# Patient Record
Sex: Male | Born: 1965 | Race: White | Hispanic: No | Marital: Single | Smoking: Current every day smoker
Health system: Southern US, Community
[De-identification: ages and names within clinical notes are randomized; demographics above are authoritative.]

## PROBLEM LIST (undated history)

## (undated) HISTORY — PX: BACK SURGERY: SHX140

## (undated) HISTORY — PX: HERNIA REPAIR: SHX51

---

## 2017-07-29 ENCOUNTER — Emergency Department (HOSPITAL_COMMUNITY)
Admission: EM | Admit: 2017-07-29 | Discharge: 2017-07-29 | Disposition: A | Discharge status: Home / Self Care | Payer: Self-pay | Attending: Emergency Medicine | Admitting: Emergency Medicine

## 2017-07-29 ENCOUNTER — Other Ambulatory Visit: Payer: Self-pay

## 2017-07-29 ENCOUNTER — Emergency Department (HOSPITAL_COMMUNITY): Payer: Self-pay

## 2017-07-29 ENCOUNTER — Encounter (HOSPITAL_COMMUNITY): Payer: Self-pay

## 2017-07-29 DIAGNOSIS — M79672 Pain in left foot: Secondary | ICD-10-CM | POA: Insufficient documentation

## 2017-07-29 DIAGNOSIS — R112 Nausea with vomiting, unspecified: Secondary | ICD-10-CM | POA: Insufficient documentation

## 2017-07-29 DIAGNOSIS — M25571 Pain in right ankle and joints of right foot: Secondary | ICD-10-CM

## 2017-07-29 DIAGNOSIS — W1789XA Other fall from one level to another, initial encounter: Secondary | ICD-10-CM | POA: Insufficient documentation

## 2017-07-29 DIAGNOSIS — Y92008 Other place in unspecified non-institutional (private) residence as the place of occurrence of the external cause: Secondary | ICD-10-CM | POA: Insufficient documentation

## 2017-07-29 DIAGNOSIS — Y999 Unspecified external cause status: Secondary | ICD-10-CM | POA: Insufficient documentation

## 2017-07-29 DIAGNOSIS — M79671 Pain in right foot: Secondary | ICD-10-CM | POA: Insufficient documentation

## 2017-07-29 DIAGNOSIS — F17210 Nicotine dependence, cigarettes, uncomplicated: Secondary | ICD-10-CM | POA: Insufficient documentation

## 2017-07-29 DIAGNOSIS — Y9389 Activity, other specified: Secondary | ICD-10-CM | POA: Insufficient documentation

## 2017-07-29 DIAGNOSIS — W19XXXA Unspecified fall, initial encounter: Secondary | ICD-10-CM

## 2017-07-29 LAB — COMPREHENSIVE METABOLIC PANEL
ALBUMIN: 3.7 g/dL (ref 3.5–5.0)
ALT: 16 U/L — ABNORMAL LOW (ref 17–63)
ANION GAP: 9 (ref 5–15)
AST: 28 U/L (ref 15–41)
Alkaline Phosphatase: 74 U/L (ref 38–126)
BILIRUBIN TOTAL: 0.7 mg/dL (ref 0.3–1.2)
BUN: 13 mg/dL (ref 6–20)
CHLORIDE: 107 mmol/L (ref 101–111)
CO2: 22 mmol/L (ref 22–32)
Calcium: 9.1 mg/dL (ref 8.9–10.3)
Creatinine, Ser: 0.74 mg/dL (ref 0.61–1.24)
GFR calc Af Amer: >60 mL/min (ref >60)
GFR calc non Af Amer: >60 mL/min (ref >60)
GLUCOSE: 116 mg/dL — AB (ref 65–99)
Potassium: 3.3 mmol/L — ABNORMAL LOW (ref 3.5–5.1)
SODIUM: 138 mmol/L (ref 135–145)
TOTAL PROTEIN: 7.1 g/dL (ref 6.5–8.1)

## 2017-07-29 LAB — CBC
HEMATOCRIT: 42.9 % (ref 39.0–52.0)
HEMOGLOBIN: 14.4 g/dL (ref 13.0–17.0)
MCH: 30.7 pg (ref 26.0–34.0)
MCHC: 33.6 g/dL (ref 30.0–36.0)
MCV: 91.5 fL (ref 78.0–100.0)
Platelets: 294 10*3/uL (ref 150–400)
RBC: 4.69 MIL/uL (ref 4.22–5.81)
RDW: 13.6 % (ref 11.5–15.5)
WBC: 11.4 10*3/uL — ABNORMAL HIGH (ref 4.0–10.5)

## 2017-07-29 LAB — LIPASE, BLOOD: LIPASE: 46 U/L (ref 11–51)

## 2017-07-29 MED ORDER — ONDANSETRON HCL 4 MG/2ML IJ SOLN
4.0000 mg | Freq: Once | INTRAMUSCULAR | Status: AC | PRN
  Administered 2017-07-29: 4 mg via INTRAVENOUS
  Filled 2017-07-29: qty 2

## 2017-07-29 MED ORDER — ACETAMINOPHEN 500 MG PO TABS: 1000.0000 mg | ORAL_TABLET | Freq: Three times a day (TID) | ORAL | 0 refills | Status: AC

## 2017-07-29 MED ORDER — KETOROLAC TROMETHAMINE 15 MG/ML IJ SOLN
15.0000 mg | Freq: Once | INTRAMUSCULAR | Status: AC
  Administered 2017-07-29: 15 mg via INTRAVENOUS
  Filled 2017-07-29: qty 1

## 2017-07-29 NOTE — ED Provider Notes (Signed)
[facility] EMERGENCY DEPARTMENT Provider Note  CSN: [id number] Arrival date & time: 07/29/17 0831  Chief Complaint(s) Marletta Lor Burgess Estelle ); Emesis; and Diarrhea  HPI Todd Hooper is a 52 y.o. male who presents with throbbing bilateral feet and ankle pain, and left hip pain following a reported fall off of the roof onto concrete.  Patient states that he was helping his friend fix a roof when he slipped and fell.  States that he landed on his feet and rolled onto his side.  Endorsed minor head injury but no loss of consciousness or amnesia to the event.  Denies any current headache or visual disturbance.  No neck pain.  No chest or back pain.  No abdominal pain.  Pain is exacerbated with ambulation, movement and palpation of the affected regions.  Patient did report that he was able to walk a mile this morning.  Has tried taking Motrin for the pain which provided minimal relief.  Denies any other associated injuries or physical complaints.  HPI  Past Medical History History reviewed. No pertinent past medical history. There are no active problems to display for this patient.  Home Medication(s) Prior to Admission medications   Medication Sig Start Date End Date Taking? Authorizing Provider  ibuprofen (ADVIL,MOTRIN) 200 MG tablet Take 800 mg by mouth as needed for moderate pain.   Yes [provider]  acetaminophen (TYLENOL) 500 MG tablet Take 2 tablets (1,000 mg total) by mouth every 8 (eight) hours for 5 days. Do not take more than 4000 mg of acetaminophen (Tylenol) in a 24-hour period. Please note that other medicines that you may be prescribed may have Tylenol as well. 07/29/17 08/03/17  Nira Conn, MD                                                                                                                                    Past Surgical History Past Surgical History:  Procedure Laterality Date  . BACK SURGERY    . HERNIA REPAIR     Family  History No family history on file.  Social History Social History   Tobacco Use  . Smoking status: Current Every Day Smoker    Types: Cigarettes  . Smokeless tobacco: Never Used  Substance Use Topics  . Alcohol use: Yes    Comment: Occasionally   . Drug use: Never   Allergies Patient has no known allergies.  Review of Systems Review of Systems All other systems are reviewed and are negative for acute change except as noted in the HPI  Physical Exam Vital Signs  I have reviewed the triage vital signs BP 109/68   Pulse 71   Temp 98.1 F (36.7 C) (Oral)   Resp 20   SpO2 98%   Physical Exam  Constitutional: He is oriented to person, place, and time. He appears well-developed and well-nourished. No distress.  HENT:  Head: Normocephalic.  Right Ear:  External ear normal.  Left Ear: External ear normal.  Mouth/Throat: Oropharynx is clear and moist.  Eyes: Pupils are equal, round, and reactive to light. Conjunctivae and EOM are normal. Right eye exhibits no discharge. Left eye exhibits no discharge. No scleral icterus.  Neck: Normal range of motion. Neck supple.  Cardiovascular: Regular rhythm and normal heart sounds. Exam reveals no gallop and no friction rub.  No murmur heard. Pulses:      Radial pulses are 2+ on the right side, and 2+ on the left side.       Dorsalis pedis pulses are 2+ on the right side, and 2+ on the left side.  Pulmonary/Chest: Effort normal and breath sounds normal. No stridor. No respiratory distress.  Abdominal: Soft. He exhibits no distension. There is no tenderness.  Musculoskeletal:       Left elbow: He exhibits normal range of motion and no deformity. No tenderness found.       Left hip: He exhibits tenderness. He exhibits normal range of motion.       Right ankle: He exhibits swelling (mild). He exhibits normal range of motion, no deformity and normal pulse. Tenderness. Lateral malleolus and medial malleolus tenderness found. Achilles tendon  exhibits no pain.       Left ankle: He exhibits normal range of motion, no swelling and normal pulse. No tenderness. Achilles tendon exhibits no pain.       Cervical back: He exhibits no bony tenderness.       Thoracic back: He exhibits no bony tenderness.       Lumbar back: He exhibits no bony tenderness.       Arms:      Right foot: There is tenderness.       Left foot: There is tenderness.  Clavicle stable. Chest stable to AP/Lat compression. Pelvis stable to Lat compression. No obvious extremity deformity. No chest or abdominal wall contusion.  Neurological: He is alert and oriented to person, place, and time. GCS eye subscore is 4. GCS verbal subscore is 5. GCS motor subscore is 6.  Moving all extremities   Skin: Skin is warm. He is not diaphoretic.    ED Results and Treatments Labs (all labs ordered are listed, but only abnormal results are displayed) Labs Reviewed  COMPREHENSIVE METABOLIC PANEL - Abnormal; Notable for the following components:      Result Value   Potassium 3.3 (*)    Glucose, Bld 116 (*)    ALT 16 (*)    All other components within normal limits  CBC - Abnormal; Notable for the following components:   WBC 11.4 (*)    All other components within normal limits  LIPASE, BLOOD                                                                                                                         EKG  EKG Interpretation  Date/Time:    Ventricular Rate:    PR Interval:  QRS Duration:   QT Interval:    QTC Calculation:   R Axis:     Text Interpretation:        Radiology Dg Ankle Complete Right  Result Date: 07/29/2017 CLINICAL DATA:  Fall from roof.  Bilateral plantar foot pain. EXAM: RIGHT ANKLE - COMPLETE 3+ VIEW COMPARISON:  None. FINDINGS: There is no evidence of fracture, dislocation, or joint effusion. There is no evidence of arthropathy or other focal bone abnormality. Soft tissues are unremarkable. IMPRESSION: Negative. Electronically  Signed   By: Charlett NoseKevin  Dover M.D.   On: 07/29/2017 10:16   Dg Foot Complete Left  Result Date: 07/29/2017 CLINICAL DATA:  Fall from roof.  Foot pain bilaterally. EXAM: LEFT FOOT - COMPLETE 3+ VIEW COMPARISON:  None. FINDINGS: No acute bony abnormality. Specifically, no fracture, subluxation, or dislocation. IMPRESSION: No acute bony abnormality. Electronically Signed   By: Charlett NoseKevin  Dover M.D.   On: 07/29/2017 10:16   Dg Foot Complete Right  Result Date: 07/29/2017 CLINICAL DATA:  Larey SeatFell off roof, bilateral foot pain. EXAM: RIGHT FOOT COMPLETE - 3+ VIEW COMPARISON:  None. FINDINGS: No acute bony abnormality. Specifically, no fracture, subluxation, or dislocation. Degenerative changes at the 1st IP joint with joint space narrowing and spurring. IMPRESSION: No acute bony abnormality. Electronically Signed   By: Charlett NoseKevin  Dover M.D.   On: 07/29/2017 10:15   Dg Hip Unilat W Or Wo Pelvis 2-3 Views Left  Result Date: 07/29/2017 CLINICAL DATA:  Larey SeatFell off roof yesterday.  Pain. EXAM: DG HIP (WITH OR WITHOUT PELVIS) 2-3V LEFT COMPARISON:  None. FINDINGS: There is no evidence of hip fracture or dislocation. There is no evidence of arthropathy or other focal bone abnormality. Degenerative change lumbar spine. IMPRESSION: Negative. Electronically Signed   By: Elsie StainJohn T Curnes M.D.   On: 07/29/2017 10:04   Pertinent labs & imaging results that were available during my care of the patient were reviewed by me and considered in my medical decision making (see chart for details).  Medications Ordered in ED Medications  ondansetron (ZOFRAN) injection 4 mg (4 mg Intravenous Given 07/29/17 0901)  ketorolac (TORADOL) 15 MG/ML injection 15 mg (15 mg Intravenous Given 07/29/17 1003)                                                                                                                                    Procedures Procedures  (including critical care time)  Medical Decision Making / ED Course I have reviewed the nursing  notes for this encounter and the patient's prior records (if available in EHR or on provided paperwork).    Patient here after a fall from a roof with bilateral feet and left hip pain.  Exam as above.  Will obtain plain films to assess for any bony injuries.  He reports minor head injury.  No focal deficits on exam.  Low suspicion for ICH.  No need for advanced imaging.  Patient  provided with IV Toradol  Plain films negative. Provided with CAM walker for comfort.  The patient appears reasonably screened and/or stabilized for discharge and I doubt any other medical condition or other [facility] requiring further screening, evaluation, or treatment in the ED at this time prior to discharge.  The patient is safe for discharge with strict return precautions.   Final Clinical Impression(s) / ED Diagnoses Final diagnoses:  Fall  Pain in both feet  Acute right ankle pain   Disposition: Discharge  Condition: Good  I have discussed the results, Dx and Tx plan with the patient who expressed understanding and agree(s) with the plan. Discharge instructions discussed at great length. The patient was given strict return precautions who verbalized understanding of the instructions. No further questions at time of discharge.    ED Discharge Orders        Ordered    acetaminophen (TYLENOL) 500 MG tablet  Every 8 hours     07/29/17 1139       Follow Up: Primary care provider  Schedule an appointment as soon as possible for a visit  As needed      This chart was dictated using voice recognition software.  Despite best efforts to proofread,  errors can occur which can change the documentation meaning.   Nira Conn, MD 07/29/17 1140

## 2017-07-29 NOTE — ED Notes (Signed)
Dr. Cardama at bedside.  

## 2017-07-29 NOTE — ED Triage Notes (Addendum)
Per [facility]: Pt was on roof yesterday around 4 pm and fell off. Pt estimates 15-20 feet, landed on his feet first then fell to the left side. No LOC. Cleared SCCA. No neck or back pain. No neuro deficits. Pt c/o pain to bilateral feet/ankles. Swelling to right ankle. Pulses present in both feet. Pt reportedly also walked about 1 mile this morning. Also c/o left elbow pain, no deformity, full ROM. Pt also c/o of left groin pain. Unrelated to the fall pt has had nausea/vomiting/diarrhea throughout the night. Denies SOB, no abd or rib pain. 16 g R AC.   Pt has had no meds PTA. Pt tried heat and ice packs at home and states that this did not help.

## 2017-07-29 NOTE — ED Notes (Signed)
Patient transported to X-ray 

## 2017-07-29 NOTE — Progress Notes (Signed)
Orthopedic Tech Progress Note Patient Details:  Todd GuthrieScott Hooper 04/15/1965 [id number]  Ortho Devices Type of Ortho Device: CAM walker Ortho Device/Splint Location: rle Ortho Device/Splint Interventions: Application   Post Interventions Patient Tolerated: Well Instructions Provided: Care of device   Nikki DomCrawford, Ariaunna Longsworth 07/29/2017, 11:01 AM

## 2017-07-29 NOTE — ED Notes (Signed)
Paged ortho tech for cam walker.

## 2017-07-29 NOTE — ED Notes (Signed)
Got patient undress on the monitor patient is resting with family at bedside and call bell in reach 

## 2018-01-31 ENCOUNTER — Emergency Department (HOSPITAL_COMMUNITY): Admission: EM | Admit: 2018-01-31 | Discharge: 2018-01-31 | Discharge status: Against Medical Advice | Payer: Self-pay

## 2018-06-03 ENCOUNTER — Emergency Department (HOSPITAL_COMMUNITY): Payer: Self-pay

## 2018-06-03 ENCOUNTER — Inpatient Hospital Stay (HOSPITAL_COMMUNITY): Payer: Self-pay

## 2018-06-03 ENCOUNTER — Encounter (HOSPITAL_COMMUNITY): Payer: Self-pay | Admitting: Emergency Medicine

## 2018-06-03 ENCOUNTER — Other Ambulatory Visit: Payer: Self-pay

## 2018-06-03 ENCOUNTER — Inpatient Hospital Stay (HOSPITAL_COMMUNITY)
Admission: EM | Admit: 2018-06-03 | Discharge: 2018-06-12 | DRG: 163 | Discharge status: Against Medical Advice | Payer: Self-pay | Attending: Family Medicine | Admitting: Family Medicine

## 2018-06-03 DIAGNOSIS — R002 Palpitations: Secondary | ICD-10-CM | POA: Diagnosis present

## 2018-06-03 DIAGNOSIS — Z09 Encounter for follow-up examination after completed treatment for conditions other than malignant neoplasm: Secondary | ICD-10-CM

## 2018-06-03 DIAGNOSIS — D62 Acute posthemorrhagic anemia: Secondary | ICD-10-CM | POA: Diagnosis not present

## 2018-06-03 DIAGNOSIS — Z4682 Encounter for fitting and adjustment of non-vascular catheter: Secondary | ICD-10-CM

## 2018-06-03 DIAGNOSIS — Z590 Homelessness: Secondary | ICD-10-CM

## 2018-06-03 DIAGNOSIS — E861 Hypovolemia: Secondary | ICD-10-CM | POA: Diagnosis not present

## 2018-06-03 DIAGNOSIS — Z720 Tobacco use: Secondary | ICD-10-CM | POA: Diagnosis present

## 2018-06-03 DIAGNOSIS — L02414 Cutaneous abscess of left upper limb: Secondary | ICD-10-CM | POA: Diagnosis present

## 2018-06-03 DIAGNOSIS — F17210 Nicotine dependence, cigarettes, uncomplicated: Secondary | ICD-10-CM | POA: Diagnosis present

## 2018-06-03 DIAGNOSIS — R0600 Dyspnea, unspecified: Secondary | ICD-10-CM

## 2018-06-03 DIAGNOSIS — E871 Hypo-osmolality and hyponatremia: Secondary | ICD-10-CM | POA: Diagnosis not present

## 2018-06-03 DIAGNOSIS — R0689 Other abnormalities of breathing: Secondary | ICD-10-CM

## 2018-06-03 DIAGNOSIS — J189 Pneumonia, unspecified organism: Secondary | ICD-10-CM | POA: Diagnosis present

## 2018-06-03 DIAGNOSIS — J181 Lobar pneumonia, unspecified organism: Secondary | ICD-10-CM

## 2018-06-03 DIAGNOSIS — J15212 Pneumonia due to Methicillin resistant Staphylococcus aureus: Principal | ICD-10-CM | POA: Diagnosis present

## 2018-06-03 DIAGNOSIS — Z9689 Presence of other specified functional implants: Secondary | ICD-10-CM

## 2018-06-03 DIAGNOSIS — F1120 Opioid dependence, uncomplicated: Secondary | ICD-10-CM | POA: Diagnosis present

## 2018-06-03 DIAGNOSIS — Z9889 Other specified postprocedural states: Secondary | ICD-10-CM

## 2018-06-03 DIAGNOSIS — K567 Ileus, unspecified: Secondary | ICD-10-CM | POA: Diagnosis not present

## 2018-06-03 DIAGNOSIS — F1410 Cocaine abuse, uncomplicated: Secondary | ICD-10-CM | POA: Diagnosis present

## 2018-06-03 DIAGNOSIS — J869 Pyothorax without fistula: Secondary | ICD-10-CM | POA: Diagnosis present

## 2018-06-03 DIAGNOSIS — F1910 Other psychoactive substance abuse, uncomplicated: Secondary | ICD-10-CM | POA: Diagnosis present

## 2018-06-03 LAB — TROPONIN I

## 2018-06-03 LAB — BASIC METABOLIC PANEL
Anion gap: 12 (ref 5–15)
BUN: 14 mg/dL (ref 6–20)
CO2: 25 mmol/L (ref 22–32)
CREATININE: 0.75 mg/dL (ref 0.61–1.24)
Calcium: 8.6 mg/dL — ABNORMAL LOW (ref 8.9–10.3)
Chloride: 92 mmol/L — ABNORMAL LOW (ref 98–111)
GFR calc Af Amer: >60 mL/min (ref >60)
GLUCOSE: 123 mg/dL — AB (ref 70–99)
Potassium: 4.5 mmol/L (ref 3.5–5.1)
SODIUM: 129 mmol/L — AB (ref 135–145)

## 2018-06-03 LAB — RAPID URINE DRUG SCREEN, HOSP PERFORMED
Amphetamines: NOT DETECTED
Barbiturates: NOT DETECTED
Benzodiazepines: NOT DETECTED
Cocaine: NOT DETECTED
Opiates: POSITIVE — AB
Tetrahydrocannabinol: NOT DETECTED

## 2018-06-03 LAB — BODY FLUID CELL COUNT WITH DIFFERENTIAL
EOS FL: 0 %
Lymphs, Fluid: 3 %
Monocyte-Macrophage-Serous Fluid: 1 % — ABNORMAL LOW (ref 50–90)
Neutrophil Count, Fluid: 96 % — ABNORMAL HIGH (ref 0–25)
Total Nucleated Cell Count, Fluid: 7571 cu mm — ABNORMAL HIGH (ref 0–1000)

## 2018-06-03 LAB — LACTATE DEHYDROGENASE, PLEURAL OR PERITONEAL FLUID: LD, Fluid: 627 U/L — ABNORMAL HIGH (ref 3–23)

## 2018-06-03 LAB — PROTEIN, TOTAL: Total Protein: 6.2 g/dL — ABNORMAL LOW (ref 6.5–8.1)

## 2018-06-03 LAB — LACTIC ACID, PLASMA: Lactic Acid, Venous: 0.9 mmol/L (ref 0.5–1.9)

## 2018-06-03 LAB — GRAM STAIN

## 2018-06-03 LAB — PROTEIN, PLEURAL OR PERITONEAL FLUID: Total protein, fluid: 5.2 g/dL

## 2018-06-03 LAB — STREP PNEUMONIAE URINARY ANTIGEN: Strep Pneumo Urinary Antigen: NEGATIVE

## 2018-06-03 LAB — LACTATE DEHYDROGENASE: LDH: 93 U/L — ABNORMAL LOW (ref 98–192)

## 2018-06-03 LAB — D-DIMER, QUANTITATIVE: D-Dimer, Quant: 5.17 ug/mL-FEU — ABNORMAL HIGH (ref 0.00–0.50)

## 2018-06-03 MED ORDER — PIPERACILLIN-TAZOBACTAM 3.375 G IVPB 30 MIN
3.3750 g | Freq: Once | INTRAVENOUS | Status: AC
  Administered 2018-06-03: 3.375 g via INTRAVENOUS
  Filled 2018-06-03: qty 50

## 2018-06-03 MED ORDER — SODIUM CHLORIDE 0.9 % IV SOLN
3.0000 g | Freq: Three times a day (TID) | INTRAVENOUS | Status: DC
  Filled 2018-06-03: qty 3

## 2018-06-03 MED ORDER — GABAPENTIN 300 MG PO CAPS
300.0000 mg | ORAL_CAPSULE | Freq: Three times a day (TID) | ORAL | Status: DC
  Administered 2018-06-03 – 2018-06-11 (×25): 300 mg via ORAL
  Filled 2018-06-03 (×25): qty 1

## 2018-06-03 MED ORDER — MORPHINE SULFATE (PF) 4 MG/ML IV SOLN
4.0000 mg | Freq: Once | INTRAVENOUS | Status: AC
  Administered 2018-06-03: 4 mg via INTRAVENOUS
  Filled 2018-06-03: qty 1

## 2018-06-03 MED ORDER — SODIUM CHLORIDE 0.9 % IV SOLN
1.0000 g | Freq: Once | INTRAVENOUS | Status: AC
  Administered 2018-06-03: 1 g via INTRAVENOUS
  Filled 2018-06-03: qty 10

## 2018-06-03 MED ORDER — PIPERACILLIN-TAZOBACTAM 3.375 G IVPB
3.3750 g | Freq: Three times a day (TID) | INTRAVENOUS | Status: DC
  Administered 2018-06-04 – 2018-06-08 (×13): 3.375 g via INTRAVENOUS
  Filled 2018-06-03 (×17): qty 50

## 2018-06-03 MED ORDER — ONDANSETRON HCL 4 MG/2ML IJ SOLN
4.0000 mg | Freq: Once | INTRAMUSCULAR | Status: AC
  Administered 2018-06-03: 4 mg via INTRAVENOUS
  Filled 2018-06-03: qty 2

## 2018-06-03 MED ORDER — SODIUM CHLORIDE 0.9% FLUSH
3.0000 mL | Freq: Two times a day (BID) | INTRAVENOUS | Status: DC
  Administered 2018-06-03: 3 mL via INTRAVENOUS

## 2018-06-03 MED ORDER — IPRATROPIUM-ALBUTEROL 0.5-2.5 (3) MG/3ML IN SOLN
3.0000 mL | Freq: Four times a day (QID) | RESPIRATORY_TRACT | Status: DC
  Administered 2018-06-03 – 2018-06-04 (×4): 3 mL via RESPIRATORY_TRACT
  Filled 2018-06-03 (×4): qty 3

## 2018-06-03 MED ORDER — HEPARIN SODIUM (PORCINE) 5000 UNIT/ML IJ SOLN
5000.0000 [IU] | Freq: Three times a day (TID) | INTRAMUSCULAR | Status: DC
  Administered 2018-06-03 – 2018-06-04 (×3): 5000 [IU] via SUBCUTANEOUS
  Filled 2018-06-03 (×3): qty 1

## 2018-06-03 MED ORDER — IOPAMIDOL (ISOVUE-370) INJECTION 76%
100.0000 mL | Freq: Once | INTRAVENOUS | Status: AC | PRN
  Administered 2018-06-03: 100 mL via INTRAVENOUS

## 2018-06-03 MED ORDER — SODIUM CHLORIDE 0.9 % IV SOLN
INTRAVENOUS | Status: DC
  Administered 2018-06-03 – 2018-06-10 (×6): via INTRAVENOUS

## 2018-06-03 MED ORDER — ADULT MULTIVITAMIN W/MINERALS CH
1.0000 | ORAL_TABLET | Freq: Every day | ORAL | Status: DC
  Administered 2018-06-03 – 2018-06-11 (×9): 1 via ORAL
  Filled 2018-06-03 (×9): qty 1

## 2018-06-03 MED ORDER — VANCOMYCIN HCL 10 G IV SOLR
1500.0000 mg | Freq: Once | INTRAVENOUS | Status: AC
  Administered 2018-06-03: 1500 mg via INTRAVENOUS
  Filled 2018-06-03: qty 1500

## 2018-06-03 MED ORDER — VANCOMYCIN HCL IN DEXTROSE 1-5 GM/200ML-% IV SOLN
1000.0000 mg | Freq: Two times a day (BID) | INTRAVENOUS | Status: DC
  Administered 2018-06-04 – 2018-06-07 (×6): 1000 mg via INTRAVENOUS
  Filled 2018-06-03 (×7): qty 200

## 2018-06-03 MED ORDER — ONDANSETRON HCL 4 MG PO TABS: 4.0000 mg | ORAL_TABLET | Freq: Four times a day (QID) | ORAL | Status: DC | PRN

## 2018-06-03 MED ORDER — HYDROXYZINE HCL 25 MG PO TABS
50.0000 mg | ORAL_TABLET | Freq: Every day | ORAL | Status: DC
  Administered 2018-06-03 – 2018-06-11 (×9): 50 mg via ORAL
  Filled 2018-06-03 (×10): qty 2

## 2018-06-03 MED ORDER — NICOTINE 21 MG/24HR TD PT24
21.0000 mg | MEDICATED_PATCH | Freq: Every day | TRANSDERMAL | Status: DC
  Administered 2018-06-03 – 2018-06-10 (×8): 21 mg via TRANSDERMAL
  Filled 2018-06-03 (×9): qty 1

## 2018-06-03 MED ORDER — GUAIFENESIN-CODEINE 100-10 MG/5ML PO SOLN
10.0000 mL | Freq: Four times a day (QID) | ORAL | Status: DC | PRN
  Administered 2018-06-03: 10 mL via ORAL
  Filled 2018-06-03: qty 10

## 2018-06-03 MED ORDER — TRAZODONE HCL 50 MG PO TABS
50.0000 mg | ORAL_TABLET | Freq: Every evening | ORAL | Status: DC | PRN
  Administered 2018-06-03 – 2018-06-10 (×3): 50 mg via ORAL
  Filled 2018-06-03 (×3): qty 1

## 2018-06-03 MED ORDER — ACETAMINOPHEN 650 MG RE SUPP: 650.0000 mg | Freq: Four times a day (QID) | RECTAL | Status: DC | PRN

## 2018-06-03 MED ORDER — SODIUM CHLORIDE 0.9 % IV SOLN: 250.0000 mL | INTRAVENOUS | Status: DC | PRN

## 2018-06-03 MED ORDER — HYDROXYZINE HCL 25 MG PO TABS
25.0000 mg | ORAL_TABLET | Freq: Three times a day (TID) | ORAL | Status: DC
  Administered 2018-06-03 – 2018-06-11 (×20): 25 mg via ORAL
  Filled 2018-06-03 (×22): qty 1

## 2018-06-03 MED ORDER — MORPHINE SULFATE (PF) 2 MG/ML IV SOLN
2.0000 mg | INTRAVENOUS | Status: DC | PRN
  Administered 2018-06-03: 2 mg via INTRAVENOUS
  Filled 2018-06-03: qty 1

## 2018-06-03 MED ORDER — POLYETHYLENE GLYCOL 3350 17 G PO PACK
17.0000 g | PACK | Freq: Every day | ORAL | Status: DC
  Administered 2018-06-05 – 2018-06-09 (×5): 17 g via ORAL
  Filled 2018-06-03 (×4): qty 1

## 2018-06-03 MED ORDER — ACETAMINOPHEN 325 MG PO TABS
650.0000 mg | ORAL_TABLET | Freq: Four times a day (QID) | ORAL | Status: DC | PRN
  Administered 2018-06-03 – 2018-06-04 (×2): 650 mg via ORAL
  Filled 2018-06-03 (×2): qty 2

## 2018-06-03 MED ORDER — KETOROLAC TROMETHAMINE 15 MG/ML IJ SOLN
15.0000 mg | Freq: Four times a day (QID) | INTRAMUSCULAR | Status: DC
  Administered 2018-06-03 – 2018-06-04 (×3): 15 mg via INTRAVENOUS
  Filled 2018-06-03 (×3): qty 1

## 2018-06-03 MED ORDER — METHOCARBAMOL 500 MG PO TABS
750.0000 mg | ORAL_TABLET | Freq: Four times a day (QID) | ORAL | Status: DC
  Administered 2018-06-03 – 2018-06-11 (×30): 750 mg via ORAL
  Filled 2018-06-03 (×31): qty 2

## 2018-06-03 MED ORDER — GUAIFENESIN ER 600 MG PO TB12
600.0000 mg | ORAL_TABLET | Freq: Two times a day (BID) | ORAL | Status: DC
  Administered 2018-06-03 – 2018-06-11 (×17): 600 mg via ORAL
  Filled 2018-06-03 (×20): qty 1

## 2018-06-03 MED ORDER — SENNA 8.6 MG PO TABS
2.0000 | ORAL_TABLET | Freq: Every day | ORAL | Status: DC
  Administered 2018-06-03 – 2018-06-08 (×5): 17.2 mg via ORAL
  Filled 2018-06-03 (×6): qty 2

## 2018-06-03 MED ORDER — SODIUM CHLORIDE 0.9 % IV SOLN
500.0000 mg | Freq: Once | INTRAVENOUS | Status: AC
  Administered 2018-06-03: 500 mg via INTRAVENOUS
  Filled 2018-06-03: qty 500

## 2018-06-03 MED ORDER — ONDANSETRON HCL 4 MG/2ML IJ SOLN
4.0000 mg | Freq: Four times a day (QID) | INTRAMUSCULAR | Status: DC | PRN
  Administered 2018-06-03 – 2018-06-04 (×2): 4 mg via INTRAVENOUS
  Filled 2018-06-03 (×2): qty 2

## 2018-06-03 MED ORDER — SODIUM CHLORIDE 0.9% FLUSH: 3.0000 mL | INTRAVENOUS | Status: DC | PRN

## 2018-06-03 NOTE — ED Provider Notes (Addendum)
[facility] EMERGENCY DEPARTMENT Provider Note   CSN: [id number] Arrival date & time: 06/03/18  7473    History   Chief Complaint Chief Complaint  Patient presents with  . Shortness of Breath    HPI Todd Hooper is a 53 y.o. male with no significant recent past medical history, sustained a personal family traumatic event 1 month ago triggering a 3-week injected cocaine binge, last exposure 1 week ago, but has developed a gradual onset of left-sided chest pain and shortness of breath.  His shortness of breath is constant, worse with attempts at lying flat and his chest pain is constant but worsened with deep inspiration.  He has had increased shortness of breath today.  Cough has been nonproductive, denies fevers or chills.  He denies history of previous lung or chest complaints.  He is a daily smoker.  He has noticed palpitations the past several days.  He denies pain or swelling in his extremities.  He has had no treatment for symptoms prior to arrival.     The history is provided by the patient and the spouse.    History reviewed. No pertinent past medical history.  Patient Active Problem List   Diagnosis Date Noted  . PNA (pneumonia) 06/03/2018    Past Surgical History:  Procedure Laterality Date  . BACK SURGERY    . HERNIA REPAIR          Home Medications    Prior to Admission medications   Medication Sig Start Date End Date Taking? Authorizing Provider  ibuprofen (ADVIL,MOTRIN) 200 MG tablet Take 800 mg by mouth as needed for moderate pain.   Yes [provider]    Family History History reviewed. No pertinent family history.  Social History Social History   Tobacco Use  . Smoking status: Current Every Day Smoker    Types: Cigarettes  . Smokeless tobacco: Never Used  Substance Use Topics  . Alcohol use: Yes    Comment: Occasionally   . Drug use: Not Currently    Types: Cocaine, Marijuana     Allergies   Patient has no known  allergies.   Review of Systems Review of Systems  Constitutional: Negative for chills and fever.  HENT: Negative for congestion and sore throat.   Eyes: Negative.   Respiratory: Positive for shortness of breath. Negative for chest tightness.   Cardiovascular: Positive for chest pain and palpitations. Negative for leg swelling.  Gastrointestinal: Negative for abdominal pain, nausea and vomiting.  Genitourinary: Negative.   Musculoskeletal: Negative for arthralgias, joint swelling and neck pain.  Skin: Negative.  Negative for rash and wound.  Neurological: Negative for dizziness, weakness, light-headedness, numbness and headaches.  Psychiatric/Behavioral: Negative.      Physical Exam Updated Vital Signs BP 112/75   Pulse 97   Resp (!) 26   Ht 5\' 8"  (1.727 m)   Wt 74.8 kg   SpO2 96%   BMI 25.09 kg/m   Physical Exam Vitals signs and nursing note reviewed.  Constitutional:      Appearance: He is well-developed. He is ill-appearing.     Comments: Patient appears uncomfortable.  HENT:     Head: Normocephalic and atraumatic.     Mouth/Throat:     Pharynx: No pharyngeal swelling or oropharyngeal exudate.  Eyes:     Conjunctiva/sclera: Conjunctivae normal.  Neck:     Musculoskeletal: Normal range of motion.  Cardiovascular:     Rate and Rhythm: Regular rhythm. Tachycardia present.     Heart sounds:  Normal heart sounds.  Pulmonary:     Effort: Tachypnea and respiratory distress present.     Breath sounds: No stridor. Examination of the left-lower field reveals decreased breath sounds. Decreased breath sounds present. No wheezing or rhonchi.  Chest:     Chest wall: Tenderness present.     Comments: Tender to palpation left lateral lower rib cage. Abdominal:     General: Bowel sounds are normal.     Palpations: Abdomen is soft. There is no mass.     Tenderness: There is no abdominal tenderness.  Musculoskeletal: Normal range of motion.     Right lower leg: He exhibits no  tenderness. No edema.     Left lower leg: He exhibits no tenderness. No edema.  Skin:    General: Skin is warm and dry.  Neurological:     Mental Status: He is alert.      ED Treatments / Results  Labs (all labs ordered are listed, but only abnormal results are displayed) Labs Reviewed  BASIC METABOLIC PANEL - Abnormal; Notable for the following components:      Result Value   Sodium 129 (*)    Chloride 92 (*)    Glucose, Bld 123 (*)    Calcium 8.6 (*)    All other components within normal limits  D-DIMER, QUANTITATIVE (NOT AT [facility]) - Abnormal; Notable for the following components:   D-Dimer, Quant 5.17 (*)    All other components within normal limits  CULTURE, BLOOD (ROUTINE X 2)  CULTURE, BLOOD (ROUTINE X 2)  TROPONIN I  LACTIC ACID, PLASMA    EKG None  Radiology Dg Chest 2 View  Result Date: 06/03/2018 CLINICAL DATA:  Chest pain, shortness of breath. EXAM: CHEST - 2 VIEW COMPARISON:  None. FINDINGS: Mild cardiomegaly is noted. No pneumothorax is noted. Right lung is clear. Moderate size left pleural effusion is noted with associated atelectasis or infiltrate. Bony thorax is unremarkable. IMPRESSION: Moderate size left pleural effusion with probable underlying atelectasis or infiltrate. Electronically Signed   By: Lupita RaiderJames  Green Jr, M.D.   On: 06/03/2018 10:14   Ct Angio Chest Pe W And/or Wo Contrast  Result Date: 06/03/2018 CLINICAL DATA:  Shortness of breath with elevated D-dimer EXAM: CT ANGIOGRAPHY CHEST WITH CONTRAST TECHNIQUE: Multidetector CT imaging of the chest was performed using the standard protocol during bolus administration of intravenous contrast. Multiplanar CT image reconstructions and MIPs were obtained to evaluate the vascular anatomy. CONTRAST:  100mL ISOVUE-370 IOPAMIDOL (ISOVUE-370) INJECTION 76% COMPARISON:  Chest radiograph June 03, 2018 FINDINGS: Cardiovascular: There is no demonstrable pulmonary embolus. There is no thoracic aortic aneurysm or  dissection. Visualized great vessels appear unremarkable. There is no pericardial effusion or pericardial thickening. The main pulmonary outflow tract measures 3.1 cm, prominent. Mediastinum/Nodes: Visualized thyroid appears normal. There is an aortopulmonary window lymph node measuring 1.5 x 1.2 cm. There is a subcarinal lymph node measuring 1.6 x 1.1 cm. There are several subcentimeter mediastinal lymph nodes as well throughout the axillary and mediastinal regions. No esophageal lesion is evident. Lungs/Pleura: There is a sizable partially loculated pleural effusion on the left. There is airspace consolidation intermingled with the loculated effusion, likely due to a combination of compressive atelectasis and pneumonia. Consolidation is most notable in the left lower lobe. There is a patchy area of infiltrate in the lateral segment of the right middle lobe with associated atelectatic change. There is apparent scarring in each apex, symmetric. There is milder atelectasis in the right lower lobe. Upper  Abdomen: Visualized upper abdominal structures appear unremarkable. Musculoskeletal: There are no blastic or lytic bone lesions. No chest wall lesions are evident. Review of the MIP images confirms the above findings. IMPRESSION: 1. No demonstrable pulmonary embolus. No thoracic aortic aneurysm or dissection. 2. Prominence of the main pulmonary outflow tract, a finding felt to be indicative of a degree of pulmonary arterial hypertension. 3. Sizable partially loculated pleural effusion on the left. Airspace consolidation is seen intermingled with the areas of loculated effusion consistent with a combination of atelectasis and pneumonia. The greatest degree of consolidation is in the left lower lobe. 4. Focal airspace consolidation in the lateral segment right middle lobe, felt to represent pneumonia. There is patchy atelectasis on the right. There is symmetric bilateral apical lung scarring. 5. Enlarged aortopulmonary  window and subcarinal lymph nodes, likely of reactive etiology given the changes in the lung parenchyma. Several subcentimeter mediastinal and axillary lymph nodes are noted as well. Electronically Signed   By: Bretta Bang III M.D.   On: 06/03/2018 13:10    Procedures Procedures (including critical care time)  Medications Ordered in ED Medications  azithromycin (ZITHROMAX) 500 mg in sodium chloride 0.9 % 250 mL IVPB (has no administration in time range)  guaiFENesin (MUCINEX) 12 hr tablet 600 mg (has no administration in time range)  ipratropium-albuterol (DUONEB) 0.5-2.5 (3) MG/3ML nebulizer solution 3 mL (has no administration in time range)  guaiFENesin-codeine 100-10 MG/5ML solution 10 mL (has no administration in time range)  morphine 4 MG/ML injection 4 mg (4 mg Intravenous Given 06/03/18 0952)  ondansetron (ZOFRAN) injection 4 mg (4 mg Intravenous Given 06/03/18 0952)  cefTRIAXone (ROCEPHIN) 1 g in sodium chloride 0.9 % 100 mL IVPB (0 g Intravenous Stopped 06/03/18 1216)  iopamidol (ISOVUE-370) 76 % injection 100 mL (100 mLs Intravenous Contrast Given 06/03/18 1250)     Initial Impression / Assessment and Plan / ED Course  I have reviewed the triage vital signs and the nursing notes.  Pertinent labs & imaging results that were available during my care of the patient were reviewed by me and considered in my medical decision making (see chart for details).        Imaging and labs reviewed and discussed with patient.  He does have a significant pneumonia with effusion, suggestion of loculation but without pulmonary embolism.  Patient will require admission given degree of discomfort, hypoxia and tachycardia.  He was given IV antibiotics here, discussed with hospitalist who agree with admission.  Final Clinical Impressions(s) / ED Diagnoses   Final diagnoses:  Community acquired pneumonia of left lower lobe of lung [facility])    ED Discharge Orders    None       Victoriano Lain 06/03/18 1348    Bethann Berkshire, MD 06/04/18 1508   CRITICAL CARE Performed by: Burgess Amor Total critical care time: 35 minutes Critical care time was exclusive of separately billable procedures and treating other patients. Critical care was necessary to treat or prevent imminent or life-threatening deterioration. Critical care was time spent personally by me on the following activities: development of treatment plan with patient and/or surrogate as well as nursing, discussions with consultants, evaluation of patient's response to treatment, examination of patient, obtaining history from patient or surrogate, ordering and performing treatments and interventions, ordering and review of laboratory studies, ordering and review of radiographic studies, pulse oximetry and re-evaluation of patient's condition.    Burgess Amor, PA-C 06/17/18 1715    Bethann Berkshire, MD 06/20/18 [phone]

## 2018-06-03 NOTE — Progress Notes (Addendum)
Pharmacy Antibiotic Note  Todd Hooper is a 53 y.o. male admitted on 06/03/2018 with sepsis/lung abscess. Pharmacy has been consulted for Unasyn dosing.  Plan: Vanco 1500 mg IV x 1 dose. Vanco 1000 mg IV every 12 hours. Zosyn 3.375g IV every 8 hours. Monitor labs, c/s, and vanco levels as indicated.  Height: 5\' 8"  (172.7 cm) Weight: 165 lb (74.8 kg) IBW/kg (Calculated) : 68.4  No data recorded.  Recent Labs  Lab 06/03/18 0926 06/03/18 1129  CREATININE 0.75  --   LATICACIDVEN  --  0.9    Estimated Creatinine Clearance: 104.5 mL/min (by C-G formula based on SCr of 0.75 mg/dL).    No Known Allergies  Antimicrobials this admission: Zosyn 2/20 >>  Vanco 2/20 >>    Dose adjustments this admission: N/A  Microbiology results: 2/20 BCx: pending  Thank you for allowing pharmacy to be a part of this patient's care.  Todd Hooper 06/03/2018 1:49 PM

## 2018-06-03 NOTE — ED Notes (Signed)
Notified respiratory of breathing treatment. 

## 2018-06-03 NOTE — ED Notes (Signed)
Respiratory notified of breathing treatment.

## 2018-06-03 NOTE — H&P (Signed)
Patient Demographics:    Todd Hooper, is a 53 y.o. male  MRN: [id number]   DOB - [birth year]  Admit Date - 06/03/2018  Outpatient Primary MD for the patient is Patient, No Pcp Per   Assessment & Plan:    Principal Problem:   PNA (pneumonia)/Lt Sided Efusion/Empyema Active Problems:   Polysubstance abuse -- IVDU (Cocaine)   Tobacco abuse    1)Left-sided Pneumonia with Effusion/Empyema----CTA chest with consolidation AND loculated effusions on the left, patient with fevers, chills, leukocytosis, tachycardia and tachypnea, no hypoxia, lactic acid is not elevated---- SIRS pathophysiology noted,  requested IR to perform ultrasound-guided diagnostic thoracentesis in the ED on 06/03/2018, thoracentesis fluid with cell count of 7551 WBCs, 96% neutrophils, LDH is 627 with a serum LDH of 93  I called and discussed this case with on-call CT surgeon at [facility] Dr. Theron AristaPeter Vantrigt----he advised transfer to [facility] for further CT surgery evaluation and possible surgical intervention  Blood cultures obtained, vancomycin and Zosyn started, give bronchodilators, mucolytics... if blood cultures are positive given history of IVDU please get transthoracic echocardiogram or TEE Thoracentesis fluid culture pending  Please re-call and reconsult Dr. Lovett SoxPeter Vantrigt in a.m. after patient arrives at [facility]  2)Lt antecubital fossa area--infected wound --  from prior vein puncture/IVDU , Purulent drainage with erythema warmth tenderness swelling and streaking----wound culture requested, vancomycin and Zosyn as above in #1, there should be low threshold for venous Doppler to rule out DVT if swelling and induration persist  3) tobacco abuse---- Smoking cessation counseling for 4 minutes today, consider nicotine patch  I  have discussed tobacco cessation with the patient.  I have counseled the patient regarding the negative impacts of continued tobacco use including but not limited to lung cancer, COPD, and cardiovascular disease.  I have discussed alternatives to tobacco and modalities that may help facilitate tobacco cessation including but not limited to biofeedback, hypnosis, and medications.  Total time spent with tobacco counseling was 4 minutes.  4)Polysubstance Abuse----UDS pending, patient states he last used cocaine about 4 days ago,----give methocarbamol, gabapentin and Atarax to help blunt withdrawal effects and to help with sleep..... Check CMP in a.m. and  if LFTs elevated please get acute viral hepatitis profile  Please be judicious with opiates and benzos   With History of - Reviewed by me  History reviewed. No pertinent past medical history.    Past Surgical History:  Procedure Laterality Date  . BACK SURGERY    . HERNIA REPAIR      Chief Complaint  Patient presents with  . Shortness of Breath      HPI:    Todd Hooper  is a 53 y.o. male medical history relevant for tobacco abuse, polysubstance abuse including IV drug use with cocaine presents to ED with left-sided chest discomfort shortness of breath of several days duration... She complains of fatigue, malaise, no high fevers, he did have some chills,... Over  the last couple days she has noticed some dyspnea on exertion cough which is not very productive... No sick contacts at home, no myalgias  In ED--- patient found to have WBC of 11.4 temp of 100.3, heart rate around 110 and respiratory rate around 30, lactic acid is not elevated--- d-dimer was elevated so CTA chest was done with findings below reflective of possible pneumonia with parapneumonic effusion/empyema--- CTA Chest -Sizable partially loculated pleural effusion on the left. Airspace consolidation is seen intermingled with the areas of loculated effusion consistent with a  combination of atelectasis and pneumonia. The greatest degree of consolidation is in the left lower lobe.  Focal airspace consolidation in the lateral segment right middle lobe, felt to represent pneumonia. There is patchy atelectasis on the right. There is symmetric bilateral apical lung scarring.  I requested IR to perform ultrasound-guided diagnostic thoracentesis in the ED on 06/03/2018, thoracentesis fluid with cell count of 7551 WBCs, 96% neutrophils, LDH is 627 with a serum LDH of 93  I called and discussed this case with on-call CT surgeon at [facility] Dr. Theron Arista Vantrigt----he advised transfer to [facility] for further CT surgery evaluation and possible surgical intervention  Blood cultures obtained, vancomycin and Zosyn started,    Review of systems:    In addition to the HPI above,   A full Review of  Systems was done, all other systems reviewed are negative except as noted above in HPI , .    Social History:  Reviewed by me    Social History   Tobacco Use  . Smoking status: Current Every Day Smoker    Types: Cigarettes  . Smokeless tobacco: Never Used  Substance Use Topics  . Alcohol use: Yes    Comment: Occasionally       Family History :  Reviewed by me  HTN   Home Medications:   Prior to Admission medications   Medication Sig Start Date End Date Taking? Authorizing Provider  ibuprofen (ADVIL,MOTRIN) 200 MG tablet Take 800 mg by mouth as needed for moderate pain.   Yes [provider]     Allergies:    No Known Allergies   Physical Exam:   Vitals  Blood pressure 122/82, pulse (!) 104, temperature 100.3 F (37.9 C), temperature source Oral, resp. rate (!) 28, height 5\' 8"  (1.727 m), weight 74.8 kg, SpO2 97 %.  Physical Examination: General appearance - alert, well appearing, and in no distress  Mental status - alert, oriented to person, place, and time,  Eyes - sclera anicteric Neck - supple, no JVD elevation , Chest  -diminished on the left with scattered rhonchi , no wheezing heart - S1 and S2 normal, regular  Abdomen - soft, nontender, nondistended, no masses or organomegaly Neurological - screening mental status exam normal, neck supple without rigidity, cranial nerves II through XII intact, DTR's normal and symmetric Extremities - , intact peripheral pulses,   Skin -left forearm area with open wound, from prior vein puncture/IVDU , Purulent drainage with erythema warmth tenderness swelling and streaking----wound culture requested    Data Review:    CBC Lab Results  Component Value Date   WBC 11.4 (H) 07/29/2017   HGB 14.4 07/29/2017   HCT 42.9 07/29/2017   MCV 91.5 07/29/2017   PLT 294 07/29/2017   ------------------------------------------------------------------------------------------------------------------  Chemistries  Recent Labs  Lab 06/03/18 0926  NA 129*  K 4.5  CL 92*  CO2 25  GLUCOSE 123*  BUN 14  CREATININE 0.75  CALCIUM 8.6*   ------------------------------------------------------------------------------------------------------------------ estimated creatinine clearance is 104.5 mL/min (by C-G formula based on SCr of 0.75 mg/dL). ------------------------------------------------------------------------------------------------------------------ No results for input(s): TSH, T4TOTAL, T3FREE, THYROIDAB in the last 72 hours.  Invalid input(s): FREET3   Coagulation profile No results for input(s): INR, PROTIME in the last 168 hours. ------------------------------------------------------------------------------------------------------------------- Recent Labs    06/03/18 1023  DDIMER 5.17*   -------------------------------------------------------------------------------------------------------------------  Cardiac Enzymes Recent Labs  Lab 06/03/18 0926  TROPONINI <0.03    ------------------------------------------------------------------------------------------------------------------ No results found for: BNP   ---------------------------------------------------------------------------------------------------------------  Urinalysis No results found for: COLORURINE, APPEARANCEUR, LABSPEC, PHURINE, GLUCOSEU, HGBUR, BILIRUBINUR, KETONESUR, PROTEINUR, UROBILINOGEN, NITRITE, LEUKOCYTESUR  ----------------------------------------------------------------------------------------------------------------   Imaging Results:    Dg Chest 1 View  Result Date: 06/03/2018 CLINICAL DATA:  Loculated LEFT pleural effusion post diagnostic thoracentesis EXAM: CHEST  1 VIEW COMPARISON:  Earlier study 06/03/2018 FINDINGS: Enlargement of cardiac silhouette. Persistent LEFT pleural effusion with atelectasis and consolidation of the lower LEFT lung. Mild RIGHT basilar atelectasis likely accentuated by expiratory technique. Upper lungs clear. No pneumothorax. Bones unremarkable. IMPRESSION: No pneumothorax following LEFT thoracentesis. Electronically Signed   By: Ulyses Southward M.D.   On: 06/03/2018 14:26   Dg Chest 2 View  Result Date: 06/03/2018 CLINICAL DATA:  Chest pain, shortness of breath. EXAM: CHEST - 2 VIEW COMPARISON:  None. FINDINGS: Mild cardiomegaly is noted. No pneumothorax is noted. Right lung is clear. Moderate size left pleural effusion is noted with associated atelectasis or infiltrate. Bony thorax is unremarkable. IMPRESSION: Moderate size left pleural effusion with probable underlying atelectasis or infiltrate. Electronically Signed   By: Lupita Raider, M.D.   On: 06/03/2018 10:14   Ct Angio Chest Pe W And/or Wo Contrast  Result Date: 06/03/2018 CLINICAL DATA:  Shortness of breath with elevated D-dimer EXAM: CT ANGIOGRAPHY CHEST WITH CONTRAST TECHNIQUE: Multidetector CT imaging of the chest was performed using the standard protocol during bolus administration of  intravenous contrast. Multiplanar CT image reconstructions and MIPs were obtained to evaluate the vascular anatomy. CONTRAST:  ISOVUE-370 IOPAMIDOL (ISOVUE-370) INJECTION 76% COMPARISON:  Chest radiograph June 03, 2018 FINDINGS: Cardiovascular: There is no demonstrable pulmonary embolus. There is no thoracic aortic aneurysm or dissection. Visualized great vessels appear unremarkable. There is no pericardial effusion or pericardial thickening. The main pulmonary outflow tract measures 3.1 cm, prominent. Mediastinum/Nodes: Visualized thyroid appears normal. There is an aortopulmonary window lymph node measuring 1.5 x 1.2 cm. There is a subcarinal lymph node measuring 1.6 x 1.1 cm. There are several subcentimeter mediastinal lymph nodes as well throughout the axillary and mediastinal regions. No esophageal lesion is evident. Lungs/Pleura: There is a sizable partially loculated pleural effusion on the left. There is airspace consolidation intermingled with the loculated effusion, likely due to a combination of compressive atelectasis and pneumonia. Consolidation is most notable in the left lower lobe. There is a patchy area of infiltrate in the lateral segment of the right middle lobe with associated atelectatic change. There is apparent scarring in each apex, symmetric. There is milder atelectasis in the right lower lobe. Upper Abdomen: Visualized upper abdominal structures appear unremarkable. Musculoskeletal: There are no blastic or lytic bone lesions. No chest wall lesions are evident. Review of the MIP images confirms the above findings. IMPRESSION: 1. No demonstrable pulmonary embolus. No thoracic aortic aneurysm or dissection. 2. Prominence of the main pulmonary outflow tract, a finding felt to be indicative of a degree of pulmonary arterial hypertension. 3. Sizable partially loculated pleural effusion on the left. Airspace consolidation is seen  intermingled with the areas of loculated effusion  consistent with a combination of atelectasis and pneumonia. The greatest degree of consolidation is in the left lower lobe. 4. Focal airspace consolidation in the lateral segment right middle lobe, felt to represent pneumonia. There is patchy atelectasis on the right. There is symmetric bilateral apical lung scarring. 5. Enlarged aortopulmonary window and subcarinal lymph nodes, likely of reactive etiology given the changes in the lung parenchyma. Several subcentimeter mediastinal and axillary lymph nodes are noted as well. Electronically Signed   By: William  Woodruff III M.D.   On: 06/03/2018 13:10   Us Thoracentesis Asp Pleural Space W/img Guide  Result Date: 06/03/2018 INDICATION: Symptomatic left sided LOCULATED pleural effusion EXAM: US THORACENTESIS ASP PLEURAL SPACE W/IMG GUIDE COMPARISON:  None. MEDICATIONS: 10 cc 1% lidocaine. COMPLICATIONS: None immediate. TECHNIQUE: Informed written consent was obtained from the patient after a discussion of the risks, benefits and alternatives to treatment. A timeout was performed prior to the initiation of the procedure. Initial ultrasound scanning demonstrates a left pleural effusion. The lower chest was prepped and draped in the usual sterile fashion. 1% lidocaine was used for local anesthesia. Under direct ultrasound guidance, a 19 gauge, 7-cm, Yueh catheter was introduced. An ultrasound image was saved for documentation purposes. The thoracentesis was performed. The catheter was removed and a dressing was applied. The patient tolerated the procedure well without immediate post procedural complication. The patient was escorted to have an upright chest radiograph. FINDINGS: A total of approximately 95 cc of cloudy yellow fluid was removed. Requested samples were sent to the laboratory. IMPRESSION: Successful ultrasound-guided left sided thoracentesis yielding 95 cc of pleural fluid. Read by Pamela A Turpin PAC Electronically Signed   By: Mark  Boles M.D.   On:  06/03/2018 15:19    Radiological Exams on Admission: Dg Chest 1 View  Result Date: 06/03/2018 CLINICAL DATA:  Loculated LEFT pleural effusion post diagnostic thoracentesis EXAM: CHEST  1 VIEW COMPARISON:  Earlier study 06/03/2018 FINDINGS: Enlargement of cardiac silhouette. Persistent LEFT pleural effusion with atelectasis and consolidation of the lower LEFT lung. Mild RIGHT basilar atelectasis likely accentuated by expiratory technique. Upper lungs clear. No pneumothorax. Bones unremarkable. IMPRESSION: No pneumothorax following LEFT thoracentesis. Electronically Signed   By: Mark  Boles M.D.   On: 06/03/2018 14:26   Dg Chest 2 View  Result Date: 06/03/2018 CLINICAL DATA:  Chest pain, shortness of breath. EXAM: CHEST - 2 VIEW COMPARISON:  None. FINDINGS: Mild cardiomegaly is noted. No pneumothorax is noted. Right lung is clear. Moderate size left pleural effusion is noted with associated atelectasis or infiltrate. Bony thorax is unremarkable. IMPRESSION: Moderate size left pleural effusion with probable underlying atelectasis or infiltrate. Electronically Signed   By: James  Green Jr, M.D.   On: 06/03/2018 10:14   Ct Angio Chest Pe W And/or Wo Contrast  Result Date: 06/03/2018 CLINICAL DATA:  Shortness of breath with elevated D-dimer EXAM: CT ANGIOGRAPHY CHEST WITH CONTRAST TECHNIQUE: Multidetector CT imaging of the chest was performed using the standard protocol during bolus administration of intravenous contrast. Multiplanar CT image reconstructions and MIPs were obtained to evaluate the vascular anatomy. CONTRAST:  <MEASUREMENElita QuickMarland Kitchen>[facility] Elita QuickMarland KitchenaLif[facility]are [facility] Elita QuickMarland KitchenaWilliam [facility] Elita QuickMarland KitchenaUniversity [facility] Elita QuickMarland KitchenaMass[facility] Elita QuickMarland Kitchena[facility] Elita QuickMarland KitchenaNorth [facility] Elita QuickMarland KitchenaCalifornia [facility] Elita QuickMarland KitchenaL[facility] Elita QuickMarland KitchenaSurgery By Vold Vision LLCasmine Pang0 IOPAMIDOL (ISOVUE-370) INJECTION 76% COMPARISON:  Chest radiograph June 03, 2018 FINDINGS: Cardiovascular: There is no demonstrable pulmonary embolus. There is no thoracic aortic aneurysm or dissection. Visualized great vessels appear unremarkable. There is no pericardial effusion or pericardial thickening. The main pulmonary outflow tract measures 3.1 cm, prominent.  Mediastinum/Nodes: Visualized thyroid appears normal. There is an aortopulmonary  window lymph node measuring 1.5 x 1.2 cm. There is a subcarinal lymph node measuring 1.6 x 1.1 cm. There are several subcentimeter mediastinal lymph nodes as well throughout the axillary and mediastinal regions. No esophageal lesion is evident. Lungs/Pleura: There is a sizable partially loculated pleural effusion on the left. There is airspace consolidation intermingled with the loculated effusion, likely due to a combination of compressive atelectasis and pneumonia. Consolidation is most notable in the left lower lobe. There is a patchy area of infiltrate in the lateral segment of the right middle lobe with associated atelectatic change. There is apparent scarring in each apex, symmetric. There is milder atelectasis in the right lower lobe. Upper Abdomen: Visualized upper abdominal structures appear unremarkable. Musculoskeletal: There are no blastic or lytic bone lesions. No chest wall lesions are evident. Review of the MIP images confirms the above findings. IMPRESSION: 1. No demonstrable pulmonary embolus. No thoracic aortic aneurysm or dissection. 2. Prominence of the main pulmonary outflow tract, a finding felt to be indicative of a degree of pulmonary arterial hypertension. 3. Sizable partially loculated pleural effusion on the left. Airspace consolidation is seen intermingled with the areas of loculated effusion consistent with a combination of atelectasis and pneumonia. The greatest degree of consolidation is in the left lower lobe. 4. Focal airspace consolidation in the lateral segment right middle lobe, felt to represent pneumonia. There is patchy atelectasis on the right. There is symmetric bilateral apical lung scarring. 5. Enlarged aortopulmonary window and subcarinal lymph nodes, likely of reactive etiology given the changes in the lung parenchyma. Several subcentimeter mediastinal and axillary lymph nodes are noted as  well. Electronically Signed   By: Bretta Bang III M.D.   On: 06/03/2018 13:10   US Thoracentesis Asp Pleural Space W/img Guide  Result Date: 06/03/2018 INDICATION: Symptomatic left sided LOCULATED pleural effusion EXAM: US THORACENTESIS ASP PLEURAL SPACE W/IMG GUIDE COMPARISON:  None. MEDICATIONS: 10 cc 1% lidocaine. COMPLICATIONS: None immediate. TECHNIQUE: Informed written consent was obtained from the patient after a discussion of the risks, benefits and alternatives to treatment. A timeout was performed prior to the initiation of the procedure. Initial ultrasound scanning demonstrates a left pleural effusion. The lower chest was prepped and draped in the usual sterile fashion. 1% lidocaine was used for local anesthesia. Under direct ultrasound guidance, a 19 gauge, 7-cm, Yueh catheter was introduced. An ultrasound image was saved for documentation purposes. The thoracentesis was performed. The catheter was removed and a dressing was applied. The patient tolerated the procedure well without immediate post procedural complication. The patient was escorted to have an upright chest radiograph. FINDINGS: A total of approximately 95 cc of cloudy yellow fluid was removed. Requested samples were sent to the laboratory. IMPRESSION: Successful ultrasound-guided left sided thoracentesis yielding 95 cc of pleural fluid. Read by Robet Leu [facility] Electronically Signed   By: Ulyses Southward M.D.   On: 06/03/2018 15:19    DVT Prophylaxis -SCD /heparin AM Labs Ordered, also please review Full Orders  Family Communication: Admission, patients condition and plan of care including tests being ordered have been discussed with the patient  who indicate understanding and agree with the plan   Code Status - Full Code  Likely DC to  home  Condition   stable  Shon Hale M.D on 06/03/2018 at 5:53 PM Go to [email or web address] -  for contact info  [facility] - Office  [phone]

## 2018-06-03 NOTE — ED Triage Notes (Signed)
Pt did cocaine x 1 month after a  Personal family trauma, has not had any drug in the last week, 3 days ago, chest pain, SOB, gradually getting worse

## 2018-06-03 NOTE — Procedures (Addendum)
   Left loculated pleural effusion  Left US guided thoracentesis 95 cc cloudy yellow fluid  Sent for labs per MD  CXR: No PTX per Dr Tyron Russell  EBL: 0

## 2018-06-04 ENCOUNTER — Inpatient Hospital Stay (HOSPITAL_COMMUNITY): Payer: Self-pay | Admitting: Certified Registered"

## 2018-06-04 ENCOUNTER — Encounter (HOSPITAL_COMMUNITY)
Admission: EM | Discharge status: Against Medical Advice | Payer: Self-pay | Source: Home / Self Care | Attending: Internal Medicine

## 2018-06-04 ENCOUNTER — Inpatient Hospital Stay (HOSPITAL_COMMUNITY): Payer: Self-pay

## 2018-06-04 ENCOUNTER — Other Ambulatory Visit: Payer: Self-pay

## 2018-06-04 ENCOUNTER — Encounter (HOSPITAL_COMMUNITY): Payer: Self-pay | Admitting: Anesthesiology

## 2018-06-04 DIAGNOSIS — J869 Pyothorax without fistula: Secondary | ICD-10-CM

## 2018-06-04 DIAGNOSIS — Z9889 Other specified postprocedural states: Secondary | ICD-10-CM

## 2018-06-04 HISTORY — PX: VIDEO ASSISTED THORACOSCOPY (VATS)/DECORTICATION: SHX6171

## 2018-06-04 HISTORY — PX: WOUND EXPLORATION: SHX6188

## 2018-06-04 HISTORY — PX: VIDEO BRONCHOSCOPY: SHX5072

## 2018-06-04 LAB — HIV ANTIBODY (ROUTINE TESTING W REFLEX): HIV Screen 4th Generation wRfx: NONREACTIVE

## 2018-06-04 LAB — BASIC METABOLIC PANEL
Anion gap: 9 (ref 5–15)
BUN: 14 mg/dL (ref 6–20)
CO2: 25 mmol/L (ref 22–32)
CREATININE: 0.76 mg/dL (ref 0.61–1.24)
Calcium: 7.9 mg/dL — ABNORMAL LOW (ref 8.9–10.3)
Chloride: 98 mmol/L (ref 98–111)
GFR calc non Af Amer: >60 mL/min (ref >60)
Glucose, Bld: 149 mg/dL — ABNORMAL HIGH (ref 70–99)
Potassium: 4.3 mmol/L (ref 3.5–5.1)
Sodium: 132 mmol/L — ABNORMAL LOW (ref 135–145)

## 2018-06-04 LAB — CBC
HCT: 35.5 % — ABNORMAL LOW (ref 39.0–52.0)
Hemoglobin: 12.2 g/dL — ABNORMAL LOW (ref 13.0–17.0)
MCH: 29.8 pg (ref 26.0–34.0)
MCHC: 34.4 g/dL (ref 30.0–36.0)
MCV: 86.8 fL (ref 80.0–100.0)
NRBC: 0 % (ref 0.0–0.2)
PLATELETS: 390 10*3/uL (ref 150–400)
RBC: 4.09 MIL/uL — AB (ref 4.22–5.81)
RDW: 13.5 % (ref 11.5–15.5)
WBC: 22.1 10*3/uL — ABNORMAL HIGH (ref 4.0–10.5)

## 2018-06-04 LAB — ACID FAST SMEAR (AFB, MYCOBACTERIA): Acid Fast Smear: NEGATIVE

## 2018-06-04 LAB — ACID FAST SMEAR (AFB)

## 2018-06-04 LAB — TYPE AND SCREEN
ABO/RH(D): O POS
ANTIBODY SCREEN: NEGATIVE

## 2018-06-04 LAB — MRSA PCR SCREENING: MRSA by PCR: NEGATIVE

## 2018-06-04 LAB — LEGIONELLA PNEUMOPHILA SEROGP 1 UR AG: L. pneumophila Serogp 1 Ur Ag: NEGATIVE

## 2018-06-04 LAB — ABO/RH: ABO/RH(D): O POS

## 2018-06-04 SURGERY — BRONCHOSCOPY, VIDEO-ASSISTED
Anesthesia: General | Site: Chest

## 2018-06-04 MED ORDER — LACTATED RINGERS IV SOLN: INTRAVENOUS | Status: DC

## 2018-06-04 MED ORDER — SODIUM CHLORIDE 0.9% FLUSH
10.0000 mL | Freq: Two times a day (BID) | INTRAVENOUS | Status: DC
  Administered 2018-06-04 – 2018-06-10 (×11): 10 mL

## 2018-06-04 MED ORDER — FENTANYL CITRATE (PF) 100 MCG/2ML IJ SOLN
100.0000 ug | Freq: Once | INTRAMUSCULAR | Status: AC
  Administered 2018-06-04: 100 ug via INTRAVENOUS

## 2018-06-04 MED ORDER — MIDAZOLAM HCL 2 MG/2ML IJ SOLN
INTRAMUSCULAR | Status: AC
  Administered 2018-06-04: 2 mg via INTRAVENOUS
  Filled 2018-06-04: qty 2

## 2018-06-04 MED ORDER — HYDROMORPHONE HCL 1 MG/ML IJ SOLN
INTRAMUSCULAR | Status: AC
  Administered 2018-06-04: 0.5 mg via INTRAVENOUS
  Filled 2018-06-04: qty 1

## 2018-06-04 MED ORDER — ACETAMINOPHEN 500 MG PO TABS
1000.0000 mg | ORAL_TABLET | Freq: Four times a day (QID) | ORAL | Status: DC
  Administered 2018-06-04 – 2018-06-07 (×6): 1000 mg via ORAL
  Filled 2018-06-04 (×6): qty 2

## 2018-06-04 MED ORDER — ACETAMINOPHEN 10 MG/ML IV SOLN
INTRAVENOUS | Status: AC
  Administered 2018-06-04: 1000 mg via INTRAVENOUS
  Filled 2018-06-04: qty 100

## 2018-06-04 MED ORDER — OXYCODONE HCL 5 MG PO TABS
5.0000 mg | ORAL_TABLET | ORAL | Status: DC | PRN
  Administered 2018-06-04 – 2018-06-11 (×13): 10 mg via ORAL
  Filled 2018-06-04 (×12): qty 2

## 2018-06-04 MED ORDER — OXYCODONE HCL 5 MG PO TABS
ORAL_TABLET | ORAL | Status: AC
  Filled 2018-06-04: qty 2

## 2018-06-04 MED ORDER — SODIUM CHLORIDE 0.9% FLUSH: 10.0000 mL | INTRAVENOUS | Status: DC | PRN

## 2018-06-04 MED ORDER — MIDAZOLAM HCL 2 MG/2ML IJ SOLN
2.0000 mg | Freq: Once | INTRAMUSCULAR | Status: AC
  Administered 2018-06-04: 2 mg via INTRAVENOUS

## 2018-06-04 MED ORDER — ACETAMINOPHEN 160 MG/5ML PO SOLN
1000.0000 mg | Freq: Four times a day (QID) | ORAL | Status: DC
  Administered 2018-06-06 (×2): 1000 mg via ORAL
  Filled 2018-06-04 (×2): qty 40.6

## 2018-06-04 MED ORDER — ONDANSETRON HCL 4 MG/2ML IJ SOLN
INTRAMUSCULAR | Status: DC | PRN
  Administered 2018-06-04: 4 mg via INTRAVENOUS

## 2018-06-04 MED ORDER — ROCURONIUM BROMIDE 10 MG/ML (PF) SYRINGE
PREFILLED_SYRINGE | INTRAVENOUS | Status: DC | PRN
  Administered 2018-06-04: 30 mg via INTRAVENOUS
  Administered 2018-06-04: 50 mg via INTRAVENOUS

## 2018-06-04 MED ORDER — DIPHENHYDRAMINE HCL 12.5 MG/5ML PO ELIX
12.5000 mg | ORAL_SOLUTION | Freq: Four times a day (QID) | ORAL | Status: DC | PRN
  Filled 2018-06-04: qty 5

## 2018-06-04 MED ORDER — MORPHINE SULFATE 2 MG/ML IV SOLN
INTRAVENOUS | Status: DC
  Administered 2018-06-05: 42 mg via INTRAVENOUS
  Administered 2018-06-05: 23:00:00 via INTRAVENOUS
  Administered 2018-06-05: 15 mg via INTRAVENOUS
  Administered 2018-06-05 – 2018-06-06 (×3): via INTRAVENOUS
  Administered 2018-06-06: 21 mg via INTRAVENOUS
  Administered 2018-06-06: 22:00:00 via INTRAVENOUS
  Administered 2018-06-07: 2 mg via INTRAVENOUS
  Administered 2018-06-07: 11:00:00 via INTRAVENOUS
  Administered 2018-06-07: 5.52 mg via INTRAVENOUS
  Administered 2018-06-07: 16.5 mg via INTRAVENOUS
  Administered 2018-06-08: 16.09 mg via INTRAVENOUS
  Administered 2018-06-08: 8.9 mg via INTRAVENOUS
  Administered 2018-06-08: 10:00:00 via INTRAVENOUS
  Filled 2018-06-04: qty 25
  Filled 2018-06-04 (×5): qty 50
  Filled 2018-06-04 (×2): qty 25

## 2018-06-04 MED ORDER — NALOXONE HCL 0.4 MG/ML IJ SOLN: 0.4000 mg | INTRAMUSCULAR | Status: DC | PRN

## 2018-06-04 MED ORDER — ONDANSETRON HCL 4 MG/2ML IJ SOLN: 4.0000 mg | Freq: Four times a day (QID) | INTRAMUSCULAR | Status: DC | PRN

## 2018-06-04 MED ORDER — EPHEDRINE 5 MG/ML INJ
INTRAVENOUS | Status: AC
  Filled 2018-06-04: qty 10

## 2018-06-04 MED ORDER — PROMETHAZINE HCL 25 MG/ML IJ SOLN: 6.2500 mg | INTRAMUSCULAR | Status: DC | PRN

## 2018-06-04 MED ORDER — ACETAMINOPHEN 10 MG/ML IV SOLN
1000.0000 mg | Freq: Once | INTRAVENOUS | Status: DC | PRN
  Administered 2018-06-04: 1000 mg via INTRAVENOUS

## 2018-06-04 MED ORDER — MEPERIDINE HCL 50 MG/ML IJ SOLN: 6.2500 mg | INTRAMUSCULAR | Status: DC | PRN

## 2018-06-04 MED ORDER — DIPHENHYDRAMINE HCL 50 MG/ML IJ SOLN: 12.5000 mg | Freq: Four times a day (QID) | INTRAMUSCULAR | Status: DC | PRN

## 2018-06-04 MED ORDER — FENTANYL CITRATE (PF) 100 MCG/2ML IJ SOLN
INTRAMUSCULAR | Status: DC | PRN
  Administered 2018-06-04 (×2): 50 ug via INTRAVENOUS
  Administered 2018-06-04: 125 ug via INTRAVENOUS

## 2018-06-04 MED ORDER — LACTATED RINGERS IV SOLN
INTRAVENOUS | Status: DC | PRN
  Administered 2018-06-04 (×2): via INTRAVENOUS

## 2018-06-04 MED ORDER — FENTANYL CITRATE (PF) 250 MCG/5ML IJ SOLN
INTRAMUSCULAR | Status: AC
  Filled 2018-06-04: qty 5

## 2018-06-04 MED ORDER — SODIUM CHLORIDE 0.9% FLUSH: 9.0000 mL | INTRAVENOUS | Status: DC | PRN

## 2018-06-04 MED ORDER — SODIUM CHLORIDE 0.9% FLUSH
9.0000 mL | INTRAVENOUS | Status: DC | PRN
  Administered 2018-06-05: 9 mL via INTRAVENOUS
  Filled 2018-06-04: qty 9

## 2018-06-04 MED ORDER — PROPOFOL 10 MG/ML IV BOLUS
INTRAVENOUS | Status: DC | PRN
  Administered 2018-06-04: 125 mg via INTRAVENOUS

## 2018-06-04 MED ORDER — 0.9 % SODIUM CHLORIDE (POUR BTL) OPTIME
TOPICAL | Status: DC | PRN
  Administered 2018-06-04 (×2): 1000 mL

## 2018-06-04 MED ORDER — FENTANYL 40 MCG/ML IV SOLN
INTRAVENOUS | Status: DC
  Administered 2018-06-04: 225 ug via INTRAVENOUS
  Administered 2018-06-04: 1000 ug via INTRAVENOUS
  Administered 2018-06-05: 270 ug via INTRAVENOUS
  Filled 2018-06-04: qty 25

## 2018-06-04 MED ORDER — SUGAMMADEX SODIUM 200 MG/2ML IV SOLN
INTRAVENOUS | Status: DC | PRN
  Administered 2018-06-04: 200 mg via INTRAVENOUS

## 2018-06-04 MED ORDER — KETOROLAC TROMETHAMINE 30 MG/ML IJ SOLN
INTRAMUSCULAR | Status: AC
  Filled 2018-06-04: qty 1

## 2018-06-04 MED ORDER — BISACODYL 5 MG PO TBEC
10.0000 mg | DELAYED_RELEASE_TABLET | Freq: Every day | ORAL | Status: DC
  Administered 2018-06-05 – 2018-06-08 (×4): 10 mg via ORAL
  Filled 2018-06-04 (×5): qty 2

## 2018-06-04 MED ORDER — DEXAMETHASONE SODIUM PHOSPHATE 10 MG/ML IJ SOLN
INTRAMUSCULAR | Status: AC
  Filled 2018-06-04: qty 1

## 2018-06-04 MED ORDER — KETOROLAC TROMETHAMINE 30 MG/ML IJ SOLN
30.0000 mg | Freq: Once | INTRAMUSCULAR | Status: AC
  Administered 2018-06-04: 30 mg via INTRAVENOUS

## 2018-06-04 MED ORDER — HYDROMORPHONE HCL 1 MG/ML IJ SOLN
0.5000 mg | INTRAMUSCULAR | Status: AC | PRN
  Administered 2018-06-04 (×2): 0.5 mg via INTRAVENOUS

## 2018-06-04 MED ORDER — LIDOCAINE 2% (20 MG/ML) 5 ML SYRINGE: INTRAMUSCULAR | Status: DC | PRN

## 2018-06-04 MED ORDER — TRAMADOL HCL 50 MG PO TABS
50.0000 mg | ORAL_TABLET | Freq: Four times a day (QID) | ORAL | Status: DC | PRN
  Administered 2018-06-05 – 2018-06-10 (×5): 100 mg via ORAL
  Filled 2018-06-04 (×5): qty 2

## 2018-06-04 MED ORDER — LIDOCAINE 2% (20 MG/ML) 5 ML SYRINGE
INTRAMUSCULAR | Status: AC
  Filled 2018-06-04: qty 10

## 2018-06-04 MED ORDER — SENNOSIDES-DOCUSATE SODIUM 8.6-50 MG PO TABS
1.0000 | ORAL_TABLET | Freq: Every day | ORAL | Status: DC
  Administered 2018-06-04 – 2018-06-07 (×4): 1 via ORAL
  Filled 2018-06-04 (×6): qty 1

## 2018-06-04 MED ORDER — FENTANYL CITRATE (PF) 100 MCG/2ML IJ SOLN
INTRAMUSCULAR | Status: AC
  Administered 2018-06-04: 100 ug via INTRAVENOUS
  Filled 2018-06-04: qty 2

## 2018-06-04 MED ORDER — ROCURONIUM BROMIDE 50 MG/5ML IV SOSY
PREFILLED_SYRINGE | INTRAVENOUS | Status: AC
  Filled 2018-06-04: qty 10

## 2018-06-04 MED ORDER — LACTATED RINGERS IV SOLN
INTRAVENOUS | Status: DC | PRN
  Administered 2018-06-04: 11:00:00 via INTRAVENOUS

## 2018-06-04 MED ORDER — ACETAMINOPHEN 325 MG PO TABS: 325.0000 mg | ORAL_TABLET | Freq: Once | ORAL | Status: DC

## 2018-06-04 MED ORDER — ACETAMINOPHEN 160 MG/5ML PO SOLN: 325.0000 mg | Freq: Once | ORAL | Status: DC

## 2018-06-04 MED ORDER — ONDANSETRON HCL 4 MG/2ML IJ SOLN
INTRAMUSCULAR | Status: AC
  Filled 2018-06-04: qty 4

## 2018-06-04 MED ORDER — POTASSIUM CHLORIDE 10 MEQ/50ML IV SOLN: 10.0000 meq | Freq: Every day | INTRAVENOUS | Status: DC | PRN

## 2018-06-04 MED ORDER — HYDROMORPHONE HCL 1 MG/ML IJ SOLN
0.2500 mg | INTRAMUSCULAR | Status: DC | PRN
  Administered 2018-06-04 (×4): 0.5 mg via INTRAVENOUS

## 2018-06-04 SURGICAL SUPPLY — 94 items
ADAPTER VALVE BIOPSY EBUS (MISCELLANEOUS) IMPLANT
ADPTR VALVE BIOPSY EBUS (MISCELLANEOUS)
APPLICATOR TIP COSEAL (VASCULAR PRODUCTS) IMPLANT
APPLICATOR TIP EXT COSEAL (VASCULAR PRODUCTS) IMPLANT
BLADE SURG 11 STRL SS (BLADE) ×4 IMPLANT
BRUSH CYTOL CELLEBRITY 1.5X140 (MISCELLANEOUS) IMPLANT
CANISTER SUCT 3000ML PPV (MISCELLANEOUS) ×4 IMPLANT
CATH KIT ON Q 5IN SLV (PAIN MANAGEMENT) IMPLANT
CATH THORACIC 28FR (CATHETERS) IMPLANT
CATH THORACIC 36FR (CATHETERS) IMPLANT
CATH THORACIC 36FR RT ANG (CATHETERS) IMPLANT
CLEANER TIP ELECTROSURG 2X2 (MISCELLANEOUS) ×4 IMPLANT
CLIP VESOCCLUDE MED 6/CT (CLIP) IMPLANT
CONN ST 1/4X3/8  BEN (MISCELLANEOUS) ×2
CONN ST 1/4X3/8 BEN (MISCELLANEOUS) ×6 IMPLANT
CONN Y 3/8X3/8X3/8  BEN (MISCELLANEOUS)
CONN Y 3/8X3/8X3/8 BEN (MISCELLANEOUS) IMPLANT
CONT SPEC 4OZ CLIKSEAL STRL BL (MISCELLANEOUS) ×8 IMPLANT
COVER BACK TABLE 60X90IN (DRAPES) ×4 IMPLANT
COVER SURGICAL LIGHT HANDLE (MISCELLANEOUS) ×4 IMPLANT
COVER WAND RF STERILE (DRAPES) IMPLANT
DERMABOND ADVANCED (GAUZE/BANDAGES/DRESSINGS)
DERMABOND ADVANCED .7 DNX12 (GAUZE/BANDAGES/DRESSINGS) IMPLANT
DISSECTOR BLUNT TIP ENDO 5MM (MISCELLANEOUS) IMPLANT
DRAIN CHANNEL 28F RND 3/8 FF (WOUND CARE) ×8 IMPLANT
DRAPE LAPAROSCOPIC ABDOMINAL (DRAPES) ×4 IMPLANT
DRAPE WARM FLUID 44X44 (DRAPE) ×4 IMPLANT
DRILL BIT 7/64X5 (BIT) IMPLANT
DRSG AQUACEL AG ADV 3.5X 4 (GAUZE/BANDAGES/DRESSINGS) ×4 IMPLANT
ELECT BLADE 4.0 EZ CLEAN MEGAD (MISCELLANEOUS) ×4
ELECT REM PT RETURN 9FT ADLT (ELECTROSURGICAL) ×4
ELECTRODE BLDE 4.0 EZ CLN MEGD (MISCELLANEOUS) ×3 IMPLANT
ELECTRODE REM PT RTRN 9FT ADLT (ELECTROSURGICAL) ×3 IMPLANT
FORCEPS BIOP RJ4 1.8 (CUTTING FORCEPS) IMPLANT
GAUZE SPONGE 4X4 12PLY STRL (GAUZE/BANDAGES/DRESSINGS) ×4 IMPLANT
GAUZE SPONGE 4X4 12PLY STRL LF (GAUZE/BANDAGES/DRESSINGS) ×4 IMPLANT
GLOVE BIO SURGEON STRL SZ 6.5 (GLOVE) ×16 IMPLANT
GLOVE BIOGEL PI IND STRL 6.5 (GLOVE) ×12 IMPLANT
GLOVE BIOGEL PI INDICATOR 6.5 (GLOVE) ×4
GLOVE INDICATOR 7.0 STRL GRN (GLOVE) ×8 IMPLANT
GLOVE SURG SS PI 6.0 STRL IVOR (GLOVE) ×4 IMPLANT
GOWN STRL REUS W/ TWL LRG LVL3 (GOWN DISPOSABLE) ×9 IMPLANT
GOWN STRL REUS W/TWL LRG LVL3 (GOWN DISPOSABLE) ×3
KIT BASIN OR (CUSTOM PROCEDURE TRAY) ×4 IMPLANT
KIT CLEAN ENDO COMPLIANCE (KITS) ×4 IMPLANT
KIT SUCTION CATH 14FR (SUCTIONS) ×4 IMPLANT
KIT TURNOVER KIT B (KITS) ×4 IMPLANT
MARKER SKIN DUAL TIP RULER LAB (MISCELLANEOUS) ×4 IMPLANT
NS IRRIG 1000ML POUR BTL (IV SOLUTION) ×16 IMPLANT
OIL SILICONE PENTAX (PARTS (SERVICE/REPAIRS)) ×4 IMPLANT
PACK CHEST (CUSTOM PROCEDURE TRAY) ×4 IMPLANT
PAD ARMBOARD 7.5X6 YLW CONV (MISCELLANEOUS) ×8 IMPLANT
PASSER SUT SWANSON 36MM LOOP (INSTRUMENTS) IMPLANT
SCISSORS LAP 5X35 DISP (ENDOMECHANICALS) IMPLANT
SEALANT PROGEL (MISCELLANEOUS) IMPLANT
SEALANT SURG COSEAL 4ML (VASCULAR PRODUCTS) IMPLANT
SEALANT SURG COSEAL 8ML (VASCULAR PRODUCTS) IMPLANT
SOLUTION ANTI FOG 6CC (MISCELLANEOUS) ×4 IMPLANT
SUT PROLENE 3 0 SH DA (SUTURE) IMPLANT
SUT PROLENE 4 0 RB 1 (SUTURE)
SUT PROLENE 4-0 RB1 .5 CRCL 36 (SUTURE) IMPLANT
SUT SILK  1 MH (SUTURE) ×4
SUT SILK 1 MH (SUTURE) ×12 IMPLANT
SUT SILK 1 TIES 10X30 (SUTURE) IMPLANT
SUT SILK 2 0SH CR/8 30 (SUTURE) IMPLANT
SUT SILK 3 0SH CR/8 30 (SUTURE) IMPLANT
SUT VIC AB 0 CTX 18 (SUTURE) ×4 IMPLANT
SUT VIC AB 1 CTX 18 (SUTURE) IMPLANT
SUT VIC AB 1 CTX 36 (SUTURE)
SUT VIC AB 1 CTX36XBRD ANBCTR (SUTURE) IMPLANT
SUT VIC AB 2-0 CTX 36 (SUTURE) ×4 IMPLANT
SUT VIC AB 3-0 X1 27 (SUTURE) ×4 IMPLANT
SUT VICRYL 0 UR6 27IN ABS (SUTURE) IMPLANT
SUT VICRYL 2 TP 1 (SUTURE) IMPLANT
SWAB COLLECTION DEVICE MRSA (MISCELLANEOUS) IMPLANT
SWAB CULTURE ESWAB REG 1ML (MISCELLANEOUS) ×4 IMPLANT
SYR 20ML ECCENTRIC (SYRINGE) ×4 IMPLANT
SYSTEM SAHARA CHEST DRAIN ATS (WOUND CARE) ×4 IMPLANT
TAPE CLOTH 4X10 WHT NS (GAUZE/BANDAGES/DRESSINGS) ×4 IMPLANT
TAPE CLOTH SURG 4X10 WHT LF (GAUZE/BANDAGES/DRESSINGS) ×4 IMPLANT
TAPE UMBILICAL COTTON 1/8X30 (MISCELLANEOUS) ×4 IMPLANT
TIP APPLICATOR SPRAY EXTEND 16 (VASCULAR PRODUCTS) IMPLANT
TOWEL GREEN STERILE (TOWEL DISPOSABLE) ×4 IMPLANT
TOWEL GREEN STERILE FF (TOWEL DISPOSABLE) ×8 IMPLANT
TRAP SPECIMEN MUCOUS 40CC (MISCELLANEOUS) ×4 IMPLANT
TRAY FOLEY CATH SILVER 16FR LF (SET/KITS/TRAYS/PACK) ×4 IMPLANT
TROCAR BLADELESS 12MM (ENDOMECHANICALS) IMPLANT
TROCAR XCEL BLUNT TIP 100MML (ENDOMECHANICALS) IMPLANT
TUBE CONNECTING 20X1/4 (TUBING) ×4 IMPLANT
TUNNELER SHEATH ON-Q 11GX8 DSP (PAIN MANAGEMENT) IMPLANT
VALVE BIOPSY  SINGLE USE (MISCELLANEOUS) ×1
VALVE BIOPSY SINGLE USE (MISCELLANEOUS) ×3 IMPLANT
VALVE SUCTION BRONCHIO DISP (MISCELLANEOUS) ×4 IMPLANT
WATER STERILE IRR 1000ML POUR (IV SOLUTION) ×8 IMPLANT

## 2018-06-04 NOTE — Progress Notes (Signed)
Found 1cmx1cm raised, open area to left antecubital with purulent drainage. Anesthesia at bedside made aware. Covered with 4x4 and tape.

## 2018-06-04 NOTE — Anesthesia Postprocedure Evaluation (Signed)
Anesthesia Post Note  Patient: IT trainer  Procedure(s) Performed: VIDEO BRONCHOSCOPY (N/A ) Packing of Left Upper Arm Wound (Left Arm Upper) Left Video Assisted Thoracoscopy (Vats)/Decortication and Drainage of Empyema (Left Chest)     Patient location during evaluation: PACU Anesthesia Type: General Level of consciousness: awake and alert Pain management: pain level controlled Vital Signs Assessment: post-procedure vital signs reviewed and stable Respiratory status: spontaneous breathing, nonlabored ventilation, respiratory function stable and patient connected to nasal cannula oxygen Cardiovascular status: blood pressure returned to baseline and stable Postop Assessment: no apparent nausea or vomiting Anesthetic complications: no    Last Vitals:  Vitals:   06/04/18 1605 06/04/18 1619  BP: 125/90 (!) 141/84  Pulse: 90 95  Resp: 20 (!) 23  Temp:    SpO2: 100% 99%                 Shelton Silvas

## 2018-06-04 NOTE — Anesthesia Procedure Notes (Signed)
Procedure Name: Intubation Date/Time: 06/04/2018 11:33 AM Performed by: Neldon Newport, CRNA Pre-anesthesia Checklist: Timeout performed, Patient being monitored, Suction available, Emergency Drugs available and Patient identified Patient Re-evaluated:Patient Re-evaluated prior to induction Oxygen Delivery Method: Circle system utilized Preoxygenation: Pre-oxygenation with 100% oxygen Induction Type: IV induction Ventilation: Mask ventilation without difficulty and Oral airway inserted - appropriate to patient size Laryngoscope Size: Mac and 4 Grade View: Grade III Tube type: Oral Tube size: 8.5 mm Number of attempts: 3 Placement Confirmation: breath sounds checked- equal and bilateral,  positive ETCO2 and ETT inserted through vocal cords under direct vision Secured at: 23 cm Tube secured with: Tape Dental Injury: Teeth and Oropharynx as per pre-operative assessment

## 2018-06-04 NOTE — Transfer of Care (Signed)
Immediate Anesthesia Transfer of Care Note  Patient: IT trainer  Procedure(s) Performed: VIDEO BRONCHOSCOPY (N/A ) Packing of Left Upper Arm Wound (Left Arm Upper) Left Video Assisted Thoracoscopy (Vats)/Decortication and Drainage of Empyema (Left Chest)  Patient Location: PACU  Anesthesia Type:General  Level of Consciousness: awake, alert  and oriented  Airway & Oxygen Therapy: Patient Spontanous Breathing and Patient connected to face mask oxygen  Post-op Assessment: Report given to RN, Post -op Vital signs reviewed and stable and Patient moving all extremities X 4  Post vital signs: Reviewed and stable  Last Vitals:  Vitals Value Taken Time  BP 120/83 06/04/2018  1:48 PM  Temp    Pulse 96 06/04/2018  1:51 PM  Resp 24 06/04/2018  1:51 PM  SpO2 96 % 06/04/2018  1:51 PM  Vitals shown include unvalidated device data.  Last Pain:  Vitals:   06/04/18 0817  TempSrc: Oral  PainSc:          Complications: No apparent anesthesia complications

## 2018-06-04 NOTE — Progress Notes (Signed)
Dr. Hart Rochester made aware of pts continued pain of 10 out of 10. 30mg  of Toradol, 3mg  of Dilaudid, 1gm of IV Tylenol, and Full Dose Fentanyl PCA started and used by patient. MD aware and no new orders given.

## 2018-06-04 NOTE — Plan of Care (Signed)
  Problem: Activity: Goal: Ability to tolerate increased activity will improve Outcome: Not Progressing   Problem: Respiratory: Goal: Ability to maintain adequate ventilation will improve Outcome: Progressing   Problem: Respiratory: Goal: Ability to maintain a clear airway will improve Outcome: Progressing

## 2018-06-04 NOTE — Anesthesia Preprocedure Evaluation (Addendum)
Anesthesia Evaluation  Patient identified by MRN, date of birth, ID band Patient awake    Reviewed: Allergy & Precautions, NPO status , Patient's Chart, lab work & pertinent test results  Airway Mallampati: I  TM Distance: >3 FB Neck ROM: Full    Dental  (+) Missing,    Pulmonary Current Smoker,     + decreased breath sounds      Cardiovascular negative cardio ROS   Rhythm:Regular Rate:Normal     Neuro/Psych negative neurological ROS  negative psych ROS   GI/Hepatic negative GI ROS, Neg liver ROS,   Endo/Other  negative endocrine ROS  Renal/GU negative Renal ROS     Musculoskeletal negative musculoskeletal ROS (+)   Abdominal Normal abdominal exam  (+)   Peds  Hematology negative hematology ROS (+)   Anesthesia Other Findings   Reproductive/Obstetrics                            Lab Results  Component Value Date   WBC 22.1 (H) 06/04/2018   HGB 12.2 (L) 06/04/2018   HCT 35.5 (L) 06/04/2018   MCV 86.8 06/04/2018   PLT 390 06/04/2018   Lab Results  Component Value Date   CREATININE 0.76 06/04/2018   BUN 14 06/04/2018   NA 132 (L) 06/04/2018   K 4.3 06/04/2018   CL 98 06/04/2018   CO2 25 06/04/2018   No results found for: INR, PROTIME  EKG: sinus tachycardia.  Anesthesia Physical Anesthesia Plan  ASA: II  Anesthesia Plan: General   Post-op Pain Management:    Induction: Intravenous  PONV Risk Score and Plan: 2 and Ondansetron, Dexamethasone and Midazolam  Airway Management Planned: Double Lumen EBT  Additional Equipment: Arterial line, CVP and Ultrasound Guidance Line Placement  Intra-op Plan:   Post-operative Plan: Extubation in OR  Informed Consent: I have reviewed the patients History and Physical, chart, labs and discussed the procedure including the risks, benefits and alternatives for the proposed anesthesia with the patient or authorized representative who  has indicated his/her understanding and acceptance.     Dental advisory given  Plan Discussed with: CRNA  Anesthesia Plan Comments:        Anesthesia Quick Evaluation

## 2018-06-04 NOTE — Progress Notes (Signed)
Received to room 2C07 from PACU via bed. Oriented to room, bed and unit.

## 2018-06-04 NOTE — Anesthesia Procedure Notes (Signed)
Arterial Line Insertion Start/End2/21/2020 11:00 AM, 06/04/2018 11:05 AM Performed by: Shelton Silvas, MD, anesthesiologist  Patient location: Pre-op. Preanesthetic checklist: patient identified, IV checked, site marked, risks and benefits discussed, surgical consent, monitors and equipment checked, pre-op evaluation, timeout performed and anesthesia consent Lidocaine 1% used for infiltration Right, radial was placed Catheter size: 20 Fr Hand hygiene performed  and maximum sterile barriers used   Attempts: 1 Procedure performed without using ultrasound guided technique. Following insertion, dressing applied. Post procedure assessment: normal and unchanged  Patient tolerated the procedure well with no immediate complications.

## 2018-06-04 NOTE — Progress Notes (Signed)
Received call from [facility] at [facility].  She stated patient's blood work indicated "gram-positive cocci".  Patient has an open wound on his left arm.  Wound might be the result IV drug use.

## 2018-06-04 NOTE — Anesthesia Procedure Notes (Signed)
Central Venous Catheter Insertion Performed by: Shelton Silvas, MD, anesthesiologist Start/End2/21/2020 10:40 AM, 06/04/2018 10:50 AM Patient location: Pre-op. Preanesthetic checklist: patient identified, IV checked, site marked, risks and benefits discussed, surgical consent, monitors and equipment checked, pre-op evaluation, timeout performed and anesthesia consent Position: Trendelenburg Lidocaine 1% used for infiltration and patient sedated Hand hygiene performed , maximum sterile barriers used  and Seldinger technique used Catheter size: 8 Fr Total catheter length 16. Central line was placed.Double lumen Procedure performed using ultrasound guided technique. Ultrasound Notes:anatomy identified, needle tip was noted to be adjacent to the nerve/plexus identified, no ultrasound evidence of intravascular and/or intraneural injection and image(s) printed for medical record Attempts: 1 Following insertion, dressing applied, line sutured and Biopatch. Post procedure assessment: blood return through all ports  Patient tolerated the procedure well with no immediate complications.

## 2018-06-04 NOTE — Brief Op Note (Addendum)
      [address]       [place] [address]             [phone]    06/04/2018  4:53 PM  PATIENT:  Todd Hooper  53 y.o. male  PRE-OPERATIVE DIAGNOSIS:  LEFT EMPYEMA  POST-OPERATIVE DIAGNOSIS:  LEFT EMPYEMA  PROCEDURE:  Procedure(s): VIDEO BRONCHOSCOPY (N/A) Packing of Left Upper Arm Wound (Left) Left Video Assisted Thoracoscopy (Vats)/Decortication and Drainage of Empyema (Left)  SURGEON:  Surgeon(s) and Role:    * Delight Ovens, MD - Primary  PHYSICIAN ASSISTANT: WAYNE GOLD PA-C  ANESTHESIA:   general  EBL:  200 mL   BLOOD ADMINISTERED:none  DRAINS: 2 BLAKE DRAINS IN THE LEFT HEMITHORAX  LOCAL MEDICATIONS USED:  NONE  SPECIMEN:  Source of Specimen:  PLEURAL PEEL AND EFFUSION  DISPOSITION OF SPECIMEN:  MICRO, CYTOLOGY AND PATHOLOGY  COUNTS:  YES   DICTATION: .Other Dictation: Dictation Number PENDING  PLAN OF CARE: Admit to inpatient   PATIENT DISPOSITION:  ICU - intubated and hemodynamically stable.   Delay start of Pharmacological VTE agent (>24hrs) due to surgical blood loss or risk of bleeding: yes

## 2018-06-04 NOTE — Progress Notes (Addendum)
PROGRESS NOTE    Todd Hooper   ZHY:[id number]  DOB: [birth year]  DOA: 06/03/2018 PCP: Patient, No Pcp Per   Brief Narrative:  Todd Hooper is a 53 y.o. male medical history relevant for tobacco abuse, polysubstance abuse including IV drug use with cocaine presents to ED with left-sided chest discomfort shortness of breath of several days duration.  In ED--- patient found to have WBC of 11.4 temp of 100.3, heart rate around 110 and respiratory rate around 30 CTA Chest -Sizable partially loculated pleural effusion on the left with multifocal infiltrates IR guided thoracentesis ordered in ED> fluid contained 7551 WBC, 96% neutrophils, LDH was 627 CT surgery called  Subjective: Patient has a mild cough.  Otherwise no complaints.    Assessment & Plan:   Principal Problem:   PNA (pneumonia)/Lt Sided Efusion/Empyema Abscess on left arm secondary to IV drug use - f/u on culture- gram stain from pleural fluid showing gram positive cocci in clusters as is the abscess culture - blood cultures negative thus far - on Zosyn and Vancomycin -Strep pneumo antigen is negative -He will have a VATS today  Active Problems: Hyponatremia -Improving-continue normal saline    Polysubstance abuse -- IVDU (Cocaine) -He states that he plans on quitting    Tobacco abuse - continue Nicotine patch  Time spent in minutes: 35 DVT prophylaxis: SCDs Code Status: Full code Family Communication:  Disposition Plan: to be determined-going for VATS today Consultants:   CT surgery Procedures:   Thoracentesis Antimicrobials:  Anti-infectives (From admission, onward)   Start     Dose/Rate Route Frequency Ordered Stop   06/04/18 0400  vancomycin (VANCOCIN) IVPB 1000 mg/200 mL premix     1,000 mg 200 mL/hr over 60 Minutes Intravenous Every 12 hours 06/03/18 1437     06/03/18 2200  piperacillin-tazobactam (ZOSYN) IVPB 3.375 g     3.375 g 12.5 mL/hr over 240 Minutes Intravenous Every 8 hours 06/03/18 1434       06/03/18 1500  Ampicillin-Sulbactam (UNASYN) 3 g in sodium chloride 0.9 % 100 mL IVPB  Status:  Discontinued     3 g 200 mL/hr over 30 Minutes Intravenous Every 8 hours 06/03/18 1349 06/03/18 1419   06/03/18 1500  vancomycin (VANCOCIN) 1,500 mg in sodium chloride 0.9 % 500 mL IVPB     1,500 mg 250 mL/hr over 120 Minutes Intravenous  Once 06/03/18 1433 06/03/18 1805   06/03/18 1430  piperacillin-tazobactam (ZOSYN) IVPB 3.375 g     3.375 g 100 mL/hr over 30 Minutes Intravenous  Once 06/03/18 1430 06/03/18 1608   06/03/18 1330  azithromycin (ZITHROMAX) 500 mg in sodium chloride 0.9 % 250 mL IVPB     500 mg 250 mL/hr over 60 Minutes Intravenous  Once 06/03/18 1315 06/03/18 1526   06/03/18 1130  cefTRIAXone (ROCEPHIN) 1 g in sodium chloride 0.9 % 100 mL IVPB     1 g 200 mL/hr over 30 Minutes Intravenous  Once 06/03/18 1115 06/03/18 1216       Objective: Vitals:   06/03/18 2334 06/04/18 0100 06/04/18 0817 06/04/18 0851  BP: 140/65  108/88   Pulse: 98 (!) 101 86   Resp: (!) 21 20    Temp: 98.4 F (36.9 C)  97.9 F (36.6 C)   TempSrc: Oral  Oral   SpO2: 94%  96% 94%  Weight:      Height:        Intake/Output Summary (Last 24 hours) at 06/04/2018 2952 Last data filed at 06/04/2018 [phone]  Gross per 24 hour  Intake 1549.86 ml  Output -  Net 1549.86 ml   Filed Weights   06/03/18 0907  Weight: 74.8 kg    Examination: General exam: Appears comfortable  HEENT: PERRLA, oral mucosa moist, no sclera icterus or thrush Respiratory system: Clear to auscultation.  Poor air entry on left respiratory effort normal. Cardiovascular system: S1 & S2 heard, RRR.   Gastrointestinal system: Abdomen soft, non-tender, nondistended. Normal bowel sounds. Central nervous system: Alert and oriented. No focal neurological deficits. Extremities: No cyanosis, clubbing or edema Skin: Abscess on left arm noted continues to drain pus but per patient it has improved Psychiatry:  Mood & affect appropriate.      Data Reviewed: I have personally reviewed following labs and imaging studies  CBC: Recent Labs  Lab 06/04/18 0812  WBC 22.1*  HGB 12.2*  HCT 35.5*  MCV 86.8  PLT 390   Basic Metabolic Panel: Recent Labs  Lab 06/03/18 0926 06/04/18 0812  NA 129* 132*  K 4.5 4.3  CL 92* 98  CO2 25 25  GLUCOSE 123* 149*  BUN 14 14  CREATININE 0.75 0.76  CALCIUM 8.6* 7.9*   GFR: Estimated Creatinine Clearance: 104.5 mL/min (by C-G formula based on SCr of 0.76 mg/dL). Liver Function Tests: Recent Labs  Lab 06/03/18 1558  PROT 6.2*   No results for input(s): LIPASE, AMYLASE in the last 168 hours. No results for input(s): AMMONIA in the last 168 hours. Coagulation Profile: No results for input(s): INR, PROTIME in the last 168 hours. Cardiac Enzymes: Recent Labs  Lab 06/03/18 0926  TROPONINI <0.03   BNP (last 3 results) No results for input(s): PROBNP in the last 8760 hours. HbA1C: No results for input(s): HGBA1C in the last 72 hours. CBG: No results for input(s): GLUCAP in the last 168 hours. Lipid Profile: No results for input(s): CHOL, HDL, LDLCALC, TRIG, CHOLHDL, LDLDIRECT in the last 72 hours. Thyroid Function Tests: No results for input(s): TSH, T4TOTAL, FREET4, T3FREE, THYROIDAB in the last 72 hours. Anemia Panel: No results for input(s): VITAMINB12, FOLATE, FERRITIN, TIBC, IRON, RETICCTPCT in the last 72 hours. Urine analysis: No results found for: COLORURINE, APPEARANCEUR, LABSPEC, PHURINE, GLUCOSEU, HGBUR, BILIRUBINUR, KETONESUR, PROTEINUR, UROBILINOGEN, NITRITE, LEUKOCYTESUR Sepsis Labs: @LABRCNTIP (procalcitonin:4,lacticidven:4) ) Recent Results (from the past 240 hour(s))  Blood culture (routine x 2)     Status: None (Preliminary result)   Collection Time: 06/03/18 11:29 AM  Result Value Ref Range Status   Specimen Description RIGHT ANTECUBITAL  Final   Special Requests   Final    BOTTLES DRAWN AEROBIC AND ANAEROBIC Blood Culture adequate volume    Culture   Final    NO GROWTH < 24 HOURS Performed at [facility], [address] [place], [place] [address]    Report Status PENDING  Incomplete  Blood culture (routine x 2)     Status: None (Preliminary result)   Collection Time: 06/03/18 11:29 AM  Result Value Ref Range Status   Specimen Description BLOOD RIGHT WRIST  Final   Special Requests   Final    BOTTLES DRAWN AEROBIC AND ANAEROBIC Blood Culture adequate volume   Culture   Final    NO GROWTH < 24 HOURS Performed at [facility], [address], [place], [place] [address]    Report Status PENDING  Incomplete  Gram stain     Status: None   Collection Time: 06/03/18  1:58 PM  Result Value Ref Range Status   Specimen Description PLEURAL LEFT  Final   Special Requests NONE  Final   Gram Stain   Final    CYTOSPIN SMEAR NO ORGANISMS SEEN WBC PRESENT,BOTH PMN AND MONONUCLEAR Performed at [facility], [address] [place], [place] [address]    Report Status 06/03/2018 FINAL  Final  Culture, body fluid-bottle     Status: None (Preliminary result)   Collection Time: 06/03/18  1:58 PM  Result Value Ref Range Status   Specimen Description PLEURAL LEFT  Final   Special Requests BOTTLES DRAWN AEROBIC AND ANAEROBIC 10CC  Final   Gram Stain   Final    GRAM POSITIVE COCCI IN CLUSTERS WBC PRESENT, PREDOMINANTLY PMN BOTH ANA AND AEB BOTTLES Gram Stain Report Called to,Read Back By and Verified With: GRAHAM,A AT 0655 ON 06/04/18 BY HUFFINES,S.    Culture   Final    NO GROWTH < 24 HOURS Performed at [facility], [address] [place], [place] [address]    Report Status PENDING  Incomplete  Aerobic Culture (superficial specimen)     Status: None (Preliminary result)   Collection Time: 06/03/18  5:23 PM  Result Value Ref Range Status   Specimen Description   Final    ABSCESS Performed at [facility], [address], [place], [place] [address]    Special Requests   Final    NONE Performed at [facility], [address] [place], [place] [address]    Gram Stain   Final    ABUNDANT WBC PRESENT, PREDOMINANTLY PMN RARE GRAM POSITIVE COCCI Performed at [facility], [address], [place], [place] [address]    Culture PENDING  Incomplete   Report Status PENDING  Incomplete         Radiology Studies: Dg Chest 1 View  Result Date: 06/03/2018 CLINICAL DATA:  Loculated LEFT pleural effusion post diagnostic thoracentesis EXAM: CHEST  1 VIEW COMPARISON:  Earlier study 06/03/2018 FINDINGS: Enlargement of cardiac silhouette. Persistent LEFT pleural effusion with atelectasis and consolidation of the lower LEFT lung. Mild RIGHT basilar atelectasis likely accentuated by expiratory technique. Upper lungs clear. No pneumothorax. Bones unremarkable. IMPRESSION: No pneumothorax following LEFT thoracentesis. Electronically Signed   By: Ulyses Southward M.D.   On: 06/03/2018 14:26   Dg Chest 2 View  Result Date: 06/03/2018 CLINICAL DATA:  Chest pain, shortness of breath. EXAM: CHEST - 2 VIEW COMPARISON:  None. FINDINGS: Mild cardiomegaly is noted. No pneumothorax is noted. Right lung is clear. Moderate size left pleural effusion is noted with associated atelectasis or infiltrate. Bony thorax is unremarkable. IMPRESSION: Moderate size left pleural effusion with probable underlying atelectasis or infiltrate. Electronically Signed   By: Lupita Raider, M.D.   On: 06/03/2018 10:14   Ct Angio Chest Pe W And/or Wo Contrast  Result Date: 06/03/2018 CLINICAL DATA:  Shortness of breath with elevated D-dimer EXAM: CT ANGIOGRAPHY CHEST WITH CONTRAST TECHNIQUE: Multidetector CT imaging of the chest was performed using the standard protocol during bolus administration of intravenous contrast. Multiplanar CT image reconstructions and MIPs were obtained to evaluate the vascular anatomy. CONTRAST:  ISOVUE-370 IOPAMIDOL (ISOVUE-370) INJECTION 76% COMPARISON:  Chest radiograph June 03, 2018 FINDINGS: Cardiovascular: There is no  demonstrable pulmonary embolus. There is no thoracic aortic aneurysm or dissection. Visualized great vessels appear unremarkable. There is no pericardial effusion or pericardial thickening. The main pulmonary outflow tract measures 3.1 cm, prominent. Mediastinum/Nodes: Visualized thyroid appears normal. There is an aortopulmonary window lymph node measuring 1.5 x 1.2 cm. There is a subcarinal lymph  node measuring 1.6 x 1.1 cm. There are several subcentimeter mediastinal lymph nodes as well throughout the axillary and mediastinal regions. No esophageal lesion is evident. Lungs/Pleura: There is a sizable partially loculated pleural effusion on the left. There is airspace consolidation intermingled with the loculated effusion, likely due to a combination of compressive atelectasis and pneumonia. Consolidation is most notable in the left lower lobe. There is a patchy area of infiltrate in the lateral segment of the right middle lobe with associated atelectatic change. There is apparent scarring in each apex, symmetric. There is milder atelectasis in the right lower lobe. Upper Abdomen: Visualized upper abdominal structures appear unremarkable. Musculoskeletal: There are no blastic or lytic bone lesions. No chest wall lesions are evident. Review of the MIP images confirms the above findings. IMPRESSION: 1. No demonstrable pulmonary embolus. No thoracic aortic aneurysm or dissection. 2. Prominence of the main pulmonary outflow tract, a finding felt to be indicative of a degree of pulmonary arterial hypertension. 3. Sizable partially loculated pleural effusion on the left. Airspace consolidation is seen intermingled with the areas of loculated effusion consistent with a combination of atelectasis and pneumonia. The greatest degree of consolidation is in the left lower lobe. 4. Focal airspace consolidation in the lateral segment right middle lobe, felt to represent pneumonia. There is patchy atelectasis on the right. There  is symmetric bilateral apical lung scarring. 5. Enlarged aortopulmonary window and subcarinal lymph nodes, likely of reactive etiology given the changes in the lung parenchyma. Several subcentimeter mediastinal and axillary lymph nodes are noted as well. Electronically Signed   By: Bretta BangWilliam  Woodruff III M.D.   On: 06/03/2018 13:10   [place] Thoracentesis Asp Pleural Space W/img Guide  Result Date: 06/03/2018 INDICATION: Symptomatic left sided LOCULATED pleural effusion EXAM: US THORACENTESIS ASP PLEURAL SPACE W/IMG GUIDE COMPARISON:  None. MEDICATIONS: 10 cc 1% lidocaine. COMPLICATIONS: None immediate. TECHNIQUE: Informed written consent was obtained from the patient after a discussion of the risks, benefits and alternatives to treatment. A timeout was performed prior to the initiation of the procedure. Initial ultrasound scanning demonstrates a left pleural effusion. The lower chest was prepped and draped in the usual sterile fashion. 1% lidocaine was used for local anesthesia. Under direct ultrasound guidance, a 19 gauge, 7-cm, Yueh catheter was introduced. An ultrasound image was saved for documentation purposes. The thoracentesis was performed. The catheter was removed and a dressing was applied. The patient tolerated the procedure well without immediate post procedural complication. The patient was escorted to have an upright chest radiograph. FINDINGS: A total of approximately 95 cc of cloudy yellow fluid was removed. Requested samples were sent to the laboratory. IMPRESSION: Successful ultrasound-guided left sided thoracentesis yielding 95 cc of pleural fluid. Read by Robet LeuPamela A [facility] Electronically Signed   By: Ulyses SouthwardMark  Boles M.D.   On: 06/03/2018 15:19      Scheduled Meds: . gabapentin  300 mg Oral TID  . guaiFENesin  600 mg Oral BID  . heparin  5,000 Units Subcutaneous Q8H  . hydrOXYzine  25 mg Oral TID  . hydrOXYzine  50 mg Oral QHS  . ipratropium-albuterol  3 mL Nebulization Q6H  . ketorolac   15 mg Intravenous Q6H  . methocarbamol  750 mg Oral Q6H  . multivitamin with minerals  1 tablet Oral Daily  . nicotine  21 mg Transdermal Daily  . polyethylene glycol  17 g Oral Daily  . senna  2 tablet Oral QHS  . sodium chloride flush  10-40 mL Intracatheter  Q12H  . sodium chloride flush  3 mL Intravenous Q12H   Continuous Infusions: . sodium chloride    . sodium chloride 125 mL/hr at 06/03/18 2326  . piperacillin-tazobactam (ZOSYN)  IV 3.375 g (06/04/18 0119)  . vancomycin 1,000 mg (06/04/18 0620)     LOS: 1 day      Calvert CantorSaima Mykeria Garman, MD [facility] Pager: [email or web address] Password [email or web address] 06/04/2018, 9:18 AM

## 2018-06-04 NOTE — Consult Note (Addendum)
[address]       [name] [address]             [phone]                    Todd Hooper [facility] Medical Record #[id number] Date of Birth: 1966/04/01  Referring:  Dr. Butler Denmark Primary Care: Patient, No Pcp Per Primary Cardiologist: No primary care provider on file.  Chief Complaint:    Chief Complaint  Patient presents with  . Shortness of Breath    History of Present Illness:    Todd Hooper 53 y.o. male transferred from [facility] during the night.  Patient seen there and thoracentesis attempted yesterday for complex left pleural effusion.  Patient presented with several days of increasing fever and cough.  He notes that he has been using IV fentanyl at home, with needle exchange program.  CT of the chest was done yesterday at [facility].      Current Activity/ Functional Status:  Patient is independent with mobility/ambulation, transfers, ADL's, IADL's.   Zubrod Score: At the time of surgery this patient's most appropriate activity status/level should be described as: [x]     0    Normal activity, no symptoms []     1    Restricted in physical strenuous activity but ambulatory, able to do out light work []     2    Ambulatory and capable of self care, unable to do work activities, up and about               >50 % of waking hours                              []     3    Only limited self care, in bed greater than 50% of waking hours []     4    Completely disabled, no self care, confined to bed or chair []     5    Moribund   History reviewed. No pertinent past medical history.  Past Surgical History:  Procedure Laterality Date  . BACK SURGERY    . HERNIA REPAIR      History reviewed. No pertinent family history.   Social History   Tobacco Use  Smoking Status Current Every Day Smoker  . Types: Cigarettes  Smokeless Tobacco Never Used    Social History   Substance and Sexual Activity  Alcohol Use Yes   Comment: Occasionally      No  Known Allergies  Current Facility-Administered Medications  Medication Dose Route Frequency Provider Last Rate Last Dose  . 0.9 %  sodium chloride infusion  250 mL Intravenous PRN Emokpae, Courage, MD      . 0.9 %  sodium chloride infusion   Intravenous Continuous Shon Hale, MD 125 mL/hr at 06/03/18 2326    . acetaminophen (TYLENOL) tablet 650 mg  650 mg Oral Q6H PRN Mariea Clonts, Courage, MD   650 mg at 06/04/18 3832   Or  . acetaminophen (TYLENOL) suppository 650 mg  650 mg Rectal Q6H PRN Emokpae, Courage, MD      . gabapentin (NEURONTIN) capsule 300 mg  300 mg Oral TID Shon Hale, MD   300 mg at 06/04/18 0820  . guaiFENesin (MUCINEX) 12 hr tablet 600 mg  600 mg Oral BID Mariea Clonts, Courage, MD   600 mg at 06/04/18 0820  . guaiFENesin-codeine 100-10 MG/5ML solution  10 mL  10 mL Oral Q6H PRN Emokpae, Courage, MD   10 mL at 06/03/18 1424  . heparin injection 5,000 Units  5,000 Units Subcutaneous Q8H Emokpae, Courage, MD   5,000 Units at 06/04/18 1610  . hydrOXYzine (ATARAX/VISTARIL) tablet 25 mg  25 mg Oral TID Shon Hale, MD   25 mg at 06/04/18 0820  . hydrOXYzine (ATARAX/VISTARIL) tablet 50 mg  50 mg Oral QHS Emokpae, Courage, MD   50 mg at 06/03/18 2335  . ipratropium-albuterol (DUONEB) 0.5-2.5 (3) MG/3ML nebulizer solution 3 mL  3 mL Nebulization Q6H Emokpae, Courage, MD   3 mL at 06/04/18 0850  . ketorolac (TORADOL) 15 MG/ML injection 15 mg  15 mg Intravenous Q6H Emokpae, Courage, MD   15 mg at 06/04/18 9604  . methocarbamol (ROBAXIN) tablet 750 mg  750 mg Oral Q6H Emokpae, Courage, MD   750 mg at 06/04/18 5409  . morphine 2 MG/ML injection 2 mg  2 mg Intravenous Q4H PRN Shon Hale, MD   2 mg at 06/03/18 2239  . multivitamin with minerals tablet 1 tablet  1 tablet Oral Daily Shon Hale, MD   1 tablet at 06/04/18 0820  . nicotine (NICODERM CQ - dosed in mg/24 hours) patch 21 mg  21 mg Transdermal Daily Emokpae, Courage, MD   21 mg at 06/04/18 0817  . ondansetron  (ZOFRAN) tablet 4 mg  4 mg Oral Q6H PRN Emokpae, Courage, MD       Or  . ondansetron (ZOFRAN) injection 4 mg  4 mg Intravenous Q6H PRN Mariea Clonts, Courage, MD   4 mg at 06/04/18 8119  . piperacillin-tazobactam (ZOSYN) IVPB 3.375 g  3.375 g Intravenous Q8H Emokpae, Courage, MD 12.5 mL/hr at 06/04/18 0119 3.375 g at 06/04/18 0119  . polyethylene glycol (MIRALAX / GLYCOLAX) packet 17 g  17 g Oral Daily Emokpae, Courage, MD      . senna (SENOKOT) tablet 17.2 mg  2 tablet Oral QHS Emokpae, Courage, MD   17.2 mg at 06/03/18 2333  . sodium chloride flush (NS) 0.9 % injection 10-40 mL  10-40 mL Intracatheter Q12H Emokpae, Courage, MD   10 mL at 06/04/18 0124  . sodium chloride flush (NS) 0.9 % injection 10-40 mL  10-40 mL Intracatheter PRN Emokpae, Courage, MD      . sodium chloride flush (NS) 0.9 % injection 3 mL  3 mL Intravenous Q12H Emokpae, Courage, MD   3 mL at 06/03/18 0122  . sodium chloride flush (NS) 0.9 % injection 3 mL  3 mL Intravenous PRN Emokpae, Courage, MD      . traZODone (DESYREL) tablet 50 mg  50 mg Oral QHS PRN Shon Hale, MD   50 mg at 06/03/18 2334  . vancomycin (VANCOCIN) IVPB 1000 mg/200 mL premix  1,000 mg Intravenous Q12H Emokpae, Courage, MD 200 mL/hr at 06/04/18 0620 1,000 mg at 06/04/18 1478    Pertinent items are noted in HPI.   Review of Systems:     Cardiac Review of Systems: [Y] = yes  or   [ N ] = no   Chest Pain [ y   ]  Resting SOB [ y  ] Exertional SOB  [name]  ]  Orthopnea [n]   Pedal Edema [  n ]    Palpitations [ n ] Syncope  [ n ]   Presyncope [n   ]   General Review of Systems: [Y] = yes [  ]=no Constitional: recent weight change [  ];  Wt loss over the last 3 months [   ] anorexia [  ]; fatigue [name][y  ]; nausea [  ]; night sweats [  ]; fever [ y ]; or chills [  ];           Eye : blurred vision [  ]; diplopia [   ]; vision changes [  ];  Amaurosis fugax[  ]; Resp: cough [  ];  wheezing[  ];  hemoptysis[  ]; shortness of breath[  ]; paroxysmal nocturnal dyspnea[   ]; dyspnea on exertion[  ]; or orthopnea[  ];  GI:  gallstones[  ], vomiting[  ];  dysphagia[  ]; melena[  ];  hematochezia [  ]; heartburn[  ];   Hx of  Colonoscopy[  ]; GU: kidney stones [  ]; hematuria[  ];   dysuria [  ];  nocturia[  ];  history of     obstruction [  ]; urinary frequency [  ]             Skin: rash, swelling[  ];, hair loss[  ];  peripheral edema[  ];  or itching[  ]; Musculosketetal: myalgias[  ];  joint swelling[  ];  joint erythema[  ];  joint pain[  ];  back pain[  ];  Heme/Lymph: bruising[  ];  bleeding[  ];  anemia[  ];  Neuro: TIA[  ];  headaches[  ];  stroke[  ];  vertigo[  ];  seizures[  ];   paresthesias[  ];  difficulty walking[  ];  Psych:depression[  ]; anxiety[  ];  Endocrine: diabetes[  ];  thyroid dysfunction[  ];  Immunizations: Flu up to date [  ]; Pneumococcal up to date [  ];  Other:     PHYSICAL EXAMINATION: BP 108/88   Pulse 86   Temp 97.9 F (36.6 C) (Oral)   Resp 20   Ht 5\' 8"  (1.727 m)   Wt 74.8 kg   SpO2 94%   BMI 25.09 kg/m  General appearance: alert, cooperative and mild distress Head: Normocephalic, without obvious abnormality, atraumatic Neck: no adenopathy, no carotid bruit, no JVD, supple, symmetrical, trachea midline and thyroid not enlarged, symmetric, no tenderness/mass/nodules Lymph nodes: Cervical, supraclavicular, and axillary nodes normal. Resp: diminished breath sounds LLL Back: negative, symmetric, no curvature. ROM normal. No CVA tenderness. Cardio: regular rate and rhythm, S1, S2 normal, no murmur, click, rub or gallop Left upper arm:     Diagnostic Studies & Laboratory data:     Recent Radiology Findings:   Dg Chest 1 View  Result Date: 06/03/2018 CLINICAL DATA:  Loculated LEFT pleural effusion post diagnostic thoracentesis EXAM: CHEST  1 VIEW COMPARISON:  Earlier study 06/03/2018 FINDINGS: Enlargement of cardiac silhouette. Persistent LEFT pleural effusion with atelectasis and consolidation of the lower  LEFT lung. Mild RIGHT basilar atelectasis likely accentuated by expiratory technique. Upper lungs clear. No pneumothorax. Bones unremarkable. IMPRESSION: No pneumothorax following LEFT thoracentesis. Electronically Signed   By: Ulyses SouthwardMark  Boles M.D.   On: 06/03/2018 14:26   Dg Chest 2 View  Result Date: 06/03/2018 CLINICAL DATA:  Chest pain, shortness of breath. EXAM: CHEST - 2 VIEW COMPARISON:  None. FINDINGS: Mild cardiomegaly is noted. No pneumothorax is noted. Right lung is clear. Moderate size left pleural effusion is noted with associated atelectasis or infiltrate. Bony thorax is unremarkable. IMPRESSION: Moderate size left pleural effusion with probable underlying atelectasis or infiltrate. Electronically Signed   By: Lupita RaiderJames  Green Jr, M.D.  On: 06/03/2018 10:14   Ct Angio Chest Pe W And/or Wo Contrast  Result Date: 06/03/2018 CLINICAL DATA:  Shortness of breath with elevated D-dimer EXAM: CT ANGIOGRAPHY CHEST WITH CONTRAST TECHNIQUE: Multidetector CT imaging of the chest was performed using the standard protocol during bolus administration of intravenous contrast. Multiplanar CT image reconstructions and MIPs were obtained to evaluate the vascular anatomy. CONTRAST:  ISOVUE-370 IOPAMIDOL (ISOVUE-370) INJECTION 76% COMPARISON:  Chest radiograph June 03, 2018 FINDINGS: Cardiovascular: There is no demonstrable pulmonary embolus. There is no thoracic aortic aneurysm or dissection. Visualized great vessels appear unremarkable. There is no pericardial effusion or pericardial thickening. The main pulmonary outflow tract measures 3.1 cm, prominent. Mediastinum/Nodes: Visualized thyroid appears normal. There is an aortopulmonary window lymph node measuring 1.5 x 1.2 cm. There is a subcarinal lymph node measuring 1.6 x 1.1 cm. There are several subcentimeter mediastinal lymph nodes as well throughout the axillary and mediastinal regions. No esophageal lesion is evident. Lungs/Pleura: There is a sizable  partially loculated pleural effusion on the left. There is airspace consolidation intermingled with the loculated effusion, likely due to a combination of compressive atelectasis and pneumonia. Consolidation is most notable in the left lower lobe. There is a patchy area of infiltrate in the lateral segment of the right middle lobe with associated atelectatic change. There is apparent scarring in each apex, symmetric. There is milder atelectasis in the right lower lobe. Upper Abdomen: Visualized upper abdominal structures appear unremarkable. Musculoskeletal: There are no blastic or lytic bone lesions. No chest wall lesions are evident. Review of the MIP images confirms the above findings. IMPRESSION: 1. No demonstrable pulmonary embolus. No thoracic aortic aneurysm or dissection. 2. Prominence of the main pulmonary outflow tract, a finding felt to be indicative of a degree of pulmonary arterial hypertension. 3. Sizable partially loculated pleural effusion on the left. Airspace consolidation is seen intermingled with the areas of loculated effusion consistent with a combination of atelectasis and pneumonia. The greatest degree of consolidation is in the left lower lobe. 4. Focal airspace consolidation in the lateral segment right middle lobe, felt to represent pneumonia. There is patchy atelectasis on the right. There is symmetric bilateral apical lung scarring. 5. Enlarged aortopulmonary window and subcarinal lymph nodes, likely of reactive etiology given the changes in the lung parenchyma. Several subcentimeter mediastinal and axillary lymph nodes are noted as well. Electronically Signed   By: Bretta Bang III M.D.   On: 06/03/2018 13:10   US Thoracentesis Asp Pleural Space W/img Guide  Result Date: 06/03/2018 INDICATION: Symptomatic left sided LOCULATED pleural effusion EXAM: US THORACENTESIS ASP PLEURAL SPACE W/IMG GUIDE COMPARISON:  None. MEDICATIONS: 10 cc 1% lidocaine. COMPLICATIONS: None immediate.  TECHNIQUE: Informed written consent was obtained from the patient after a discussion of the risks, benefits and alternatives to treatment. A timeout was performed prior to the initiation of the procedure. Initial ultrasound scanning demonstrates a left pleural effusion. The lower chest was prepped and draped in the usual sterile fashion. 1% lidocaine was used for local anesthesia. Under direct ultrasound guidance, a 19 gauge, 7-cm, Yueh catheter was introduced. An ultrasound image was saved for documentation purposes. The thoracentesis was performed. The catheter was removed and a dressing was applied. The patient tolerated the procedure well without immediate post procedural complication. The patient was escorted to have an upright chest radiograph. FINDINGS: A total of approximately 95 cc of cloudy yellow fluid was removed. Requested samples were sent to the laboratory. IMPRESSION: Successful ultrasound-guided left sided thoracentesis yielding  95 cc of pleural fluid. Read by Robet LeuPamela A Turpin [facility] Electronically Signed   By: Ulyses SouthwardMark  Boles M.D.   On: 06/03/2018 15:19     I have independently reviewed the above radiology studies  and reviewed the findings with the patient.   Recent Lab Findings: Lab Results  Component Value Date   WBC 22.1 (H) 06/04/2018   HGB 12.2 (L) 06/04/2018   HCT 35.5 (L) 06/04/2018   PLT 390 06/04/2018   GLUCOSE 149 (H) 06/04/2018   ALT 16 (L) 07/29/2017   AST 28 07/29/2017   NA 132 (L) 06/04/2018   K 4.3 06/04/2018   CL 98 06/04/2018   CREATININE 0.76 06/04/2018   BUN 14 06/04/2018   CO2 25 06/04/2018      Assessment / Plan:   Patient presents with fever complex loculated left pleural effusion consistent with empyema, I discussed with the patient proceeding with bronchoscopy left video-assisted thoracoscopy drainage of empyema with decortication, after failure of drainage with thoracentesis yesterday. Active history of IV drug use, with fentanyl, patient does no use of  needle exchange program Left upper arm skin abscess   The goals risks and alternatives of the planned surgical procedure Procedure(s): VIDEO ASSISTED THORACOSCOPY (VATS)/EMPYEMA (Left) VIDEO BRONCHOSCOPY (N/A)  have been discussed with the patient in detail. The risks of the procedure including death, infection, stroke, myocardial infarction, bleeding, blood transfusion have all been discussed specifically.  I have quoted Dole FoodScott Komperda a 2 % of perioperative mortality and a complication rate as high as 40 %. The patient's questions have been answered.Lorin PicketScott Kalama is willing  to proceed with the planned procedure.    Delight OvensEdward B Jaline Pincock MD      [address] [place] [id number] Office [phone]   Beeper [phone]  06/04/2018 9:54 AM

## 2018-06-04 NOTE — Anesthesia Procedure Notes (Addendum)
Procedure Name: Intubation Date/Time: 06/04/2018 11:56 AM Performed by: Carmela Rima, CRNA Pre-anesthesia Checklist: Timeout performed, Patient being monitored, Suction available, Emergency Drugs available and Patient identified Patient Re-evaluated:Patient Re-evaluated prior to induction Oxygen Delivery Method: Circle system utilized Tube type: MLT (used tube exchanger) Endobronchial tube: Left and 39 Fr Number of attempts: 1 Placement Confirmation: breath sounds checked- equal and bilateral and positive ETCO2 Tube secured with: Tape Dental Injury: Injury to lip

## 2018-06-05 ENCOUNTER — Inpatient Hospital Stay (HOSPITAL_COMMUNITY): Payer: Self-pay

## 2018-06-05 ENCOUNTER — Encounter (HOSPITAL_COMMUNITY): Payer: Self-pay | Admitting: Cardiothoracic Surgery

## 2018-06-05 DIAGNOSIS — R7881 Bacteremia: Secondary | ICD-10-CM

## 2018-06-05 LAB — BLOOD GAS, ARTERIAL
Acid-Base Excess: 3.1 mmol/L — ABNORMAL HIGH (ref 0.0–2.0)
Bicarbonate: 26.6 mmol/L (ref 20.0–28.0)
Drawn by: 511471
O2 Content: 2 L/min
O2 Saturation: 93.6 %
PH ART: 7.467 — AB (ref 7.350–7.450)
PO2 ART: 66 mmHg — AB (ref 83.0–108.0)
Patient temperature: 98.9
pCO2 arterial: 37.3 mmHg (ref 32.0–48.0)

## 2018-06-05 LAB — CBC
HCT: 34.6 % — ABNORMAL LOW (ref 39.0–52.0)
Hemoglobin: 11 g/dL — ABNORMAL LOW (ref 13.0–17.0)
MCH: 28.3 pg (ref 26.0–34.0)
MCHC: 31.8 g/dL (ref 30.0–36.0)
MCV: 88.9 fL (ref 80.0–100.0)
Platelets: 432 10*3/uL — ABNORMAL HIGH (ref 150–400)
RBC: 3.89 MIL/uL — ABNORMAL LOW (ref 4.22–5.81)
RDW: 13.6 % (ref 11.5–15.5)
WBC: 13.3 10*3/uL — ABNORMAL HIGH (ref 4.0–10.5)
nRBC: 0 % (ref 0.0–0.2)

## 2018-06-05 LAB — ECHOCARDIOGRAM COMPLETE
Height: 68 in
Weight: 2638.47 oz

## 2018-06-05 LAB — BASIC METABOLIC PANEL
Anion gap: 10 (ref 5–15)
BUN: 7 mg/dL (ref 6–20)
CO2: 23 mmol/L (ref 22–32)
Calcium: 7.8 mg/dL — ABNORMAL LOW (ref 8.9–10.3)
Chloride: 102 mmol/L (ref 98–111)
Creatinine, Ser: 0.74 mg/dL (ref 0.61–1.24)
GFR calc Af Amer: >60 mL/min (ref >60)
GFR calc non Af Amer: >60 mL/min (ref >60)
Glucose, Bld: 112 mg/dL — ABNORMAL HIGH (ref 70–99)
POTASSIUM: 4 mmol/L (ref 3.5–5.1)
Sodium: 135 mmol/L (ref 135–145)

## 2018-06-05 MED ORDER — IPRATROPIUM-ALBUTEROL 0.5-2.5 (3) MG/3ML IN SOLN: 3.0000 mL | RESPIRATORY_TRACT | Status: DC | PRN

## 2018-06-05 MED ORDER — IPRATROPIUM-ALBUTEROL 0.5-2.5 (3) MG/3ML IN SOLN
3.0000 mL | Freq: Three times a day (TID) | RESPIRATORY_TRACT | Status: DC
  Administered 2018-06-05 (×3): 3 mL via RESPIRATORY_TRACT
  Filled 2018-06-05 (×3): qty 3

## 2018-06-05 NOTE — Progress Notes (Signed)
Alert and oriented x 4. Pain management maintained. Continues on Morphine PCA pump. PRN pain medication available as well (see MAR). Chest tube to left lateral chest wall remains intact. Draining serosanguinous fluid with minimal output. Zosyn IV therapy continues with no adverse reactions. Call bell within reach. Encouraged to report s/s to staff.

## 2018-06-05 NOTE — Progress Notes (Signed)
PROGRESS NOTE  Deitrich Steve ATF:[id number] DOB: [birth year] DOA: 06/03/2018 PCP: Patient, No Pcp Per  Brief History   Todd Hooper is a52 y.o.malemedical history relevant for tobacco abuse, polysubstance abuse including IV drug use with cocaine presents to ED with left-sided chest discomfort shortness of breath of several days duration.  In ED---patient found to have WBC of 11.4 temp of 100.3,heart rate around 110 and respiratory rate around 30 CTA Chest -Sizable partially loculated pleural effusion on the left with multifocal infiltrates IR guided thoracentesis ordered in ED> fluid contained 7551 WBC, 96% neutrophils, LDH was 627 CT surgery called. The patient underwent a VATS on 06/04/2018 with placement of two drainage catheters.  A & P  Principal Problem: PNA (pneumonia)/Lt Sided Efusion/Empyema Abscess on left arm secondary to IV drug use Blood cultures negative thus far Strep pneumo antigen is negative S/P VATS 06/04/2018 and placement of drainage catheters x 2. S/P BAL  Gram stain was polymicrobial. Cultures have had no growth. Pleural fluid has grown out Staph aureus. Sensitivities are pending. The patient is on on Zosyn and Vancomycin  Active Problems: Hyponatremia: Resolved. Monitor.  Polysubstance abuse -- IVDU (Cocaine and Fentanyl) He states that he plans on quitting  Tobacco abuse Continue Nicotine patch  DVT prophylaxis: SCD's Code Status: Full Code Family Communication: None at bedside. Colin Broach Donnellson) [phone] Disposition Plan: tbd    Karie Kirks, DO [facility] Direct contact: see [email or web address]  7PM-7AM contact night coverage as above 06/05/2018, 1:23 PM  LOS: 2 days   Consultants  . Cardiothoracic Surgery  Procedures  . BAL, VATS  Antibiotics  . Vancomycin and Zosyn  Interval History/Subjective  The patient complains of being sore. No other new complaints.  Objective   Vitals:  Vitals:   06/05/18 1200 06/05/18 1209  BP: (!)  142/101   Pulse: 85   Resp: (!) 22 20  Temp: 97.7 F (36.5 C)   SpO2: 96% 97%    Exam:  Constitutional:  . The patient is awake, alert, and oriented x 3. No acute distress. Respiratory:  . CTA bilaterally, no wheezes, rales, or rhonchi. . No tactile fremitus . No increased work of breathing. Cardiovascular:  . Regular rate and rhythm, no murmurs, ectopy, or gallups. . No LE extremity edema   . Normal pedal pulses Abdomen:  . Abdomen is soft, non-tender, non-distended. . No hernias, masses, or organomegaly. . Normoactive bowel sounds. Musculoskeletal:  . No cyanosis, clubbing or edema. Skin:  . No rashes, lesions, ulcers . palpation of skin: no induration or nodules Neurologic:  . CN 2-12 intact . Sensation all 4 extremities intact Psychiatric:  . Mental status o Mood, affect appropriate o Orientation to person, place, time  . judgment and insight appear intact   I have personally reviewed the following:   Today's Data  . CBC, BMP, Cultures, imaging.   Scheduled Meds: . acetaminophen  1,000 mg Oral Q6H   Or  . acetaminophen (TYLENOL) oral liquid 160 mg/5 mL  1,000 mg Oral Q6H  . bisacodyl  10 mg Oral Daily  . gabapentin  300 mg Oral TID  . guaiFENesin  600 mg Oral BID  . hydrOXYzine  25 mg Oral TID  . hydrOXYzine  50 mg Oral QHS  . ipratropium-albuterol  3 mL Nebulization TID  . methocarbamol  750 mg Oral Q6H  . morphine   Intravenous Q4H  . multivitamin with minerals  1 tablet Oral Daily  . nicotine  21 mg Transdermal Daily  . polyethylene  glycol  17 g Oral Daily  . senna  2 tablet Oral QHS  . senna-docusate  1 tablet Oral QHS  . sodium chloride flush  10-40 mL Intracatheter Q12H   Continuous Infusions: . sodium chloride    . sodium chloride 125 mL/hr at 06/05/18 0600  . piperacillin-tazobactam (ZOSYN)  IV 3.375 g (06/05/18 0610)  . potassium chloride    . vancomycin 200 mL/hr at 06/05/18 0600    Principal Problem:   PNA (pneumonia)/Lt Sided  Efusion/Empyema Active Problems:   Polysubstance abuse -- IVDU (Cocaine)   Tobacco abuse   Empyema of left pleural space (HCC)   LOS: 2 days

## 2018-06-05 NOTE — Progress Notes (Signed)
[address]       [place] [address]             [phone]      1 Day Post-Op Procedure(s) (LRB): VIDEO BRONCHOSCOPY (N/A) Packing of Left Upper Arm Wound (Left) Left Video Assisted Thoracoscopy (Vats)/Decortication and Drainage of Empyema (Left) Subjective: Feels a little better, + sputum production- breating is improved  Objective: Vital signs in last 24 hours: Temp:  [97.7 F (36.5 C)-100.8 F (38.2 C)] 99.4 F (37.4 C) (02/22 1628) Pulse Rate:  [81-102] 85 (02/22 1628) Cardiac Rhythm: Normal sinus rhythm (02/22 0800) Resp:  [14-24] 20 (02/22 1628) BP: (136-156)/(74-101) 150/87 (02/22 1628) SpO2:  [93 %-99 %] 97 % (02/22 1628) Arterial Line BP: (156-174)/(68-80) 158/76 (02/22 1600)  Hemodynamic parameters for last 24 hours:    Intake/Output from previous day: 02/21 0701 - 02/22 0700 In: 3472.5 [P.O.:360; I.V.:2612.5; IV Piggyback:250] Out: 1525 [Urine:1125; Blood:200; Chest Tube:200] Intake/Output this shift: Total I/O In: 370 [P.O.:360; I.V.:10] Out: 2400 [Urine:2400]  General appearance: alert, cooperative and no distress Heart: regular rate and rhythm Lungs: dim in left>right base Abdomen: Mod distension, + hyperactice BS. mild diffuse tenderness, + right inguinal hernia, non tender, easily reduceable. + flatus Extremities: PAS in place- no edema Wound: dressings intact  Lab Results: Recent Labs    06/04/18 0812 06/05/18 0528  WBC 22.1* 13.3*  HGB 12.2* 11.0*  HCT 35.5* 34.6*  PLT 390 432*   BMET:  Recent Labs    06/04/18 0812 06/05/18 0528  NA 132* 135  K 4.3 4.0  CL 98 102  CO2 25 23  GLUCOSE 149* 112*  BUN 14 7  CREATININE 0.76 0.74  CALCIUM 7.9* 7.8*    PT/INR: No results for input(s): LABPROT, INR in the last 72 hours. ABG    Component Value Date/Time   PHART 7.467 (H) 06/05/2018 0450   HCO3 26.6 06/05/2018 0450   O2SAT 93.6 06/05/2018 0450   CBG (last 3)  No results for input(s): GLUCAP in the last  72 hours.  Meds Scheduled Meds: . acetaminophen  1,000 mg Oral Q6H   Or  . acetaminophen (TYLENOL) oral liquid 160 mg/5 mL  1,000 mg Oral Q6H  . bisacodyl  10 mg Oral Daily  . gabapentin  300 mg Oral TID  . guaiFENesin  600 mg Oral BID  . hydrOXYzine  25 mg Oral TID  . hydrOXYzine  50 mg Oral QHS  . ipratropium-albuterol  3 mL Nebulization TID  . methocarbamol  750 mg Oral Q6H  . morphine   Intravenous Q4H  . multivitamin with minerals  1 tablet Oral Daily  . nicotine  21 mg Transdermal Daily  . polyethylene glycol  17 g Oral Daily  . senna  2 tablet Oral QHS  . senna-docusate  1 tablet Oral QHS  . sodium chloride flush  10-40 mL Intracatheter Q12H   Continuous Infusions: . sodium chloride    . sodium chloride 125 mL/hr at 06/05/18 0600  . piperacillin-tazobactam (ZOSYN)  IV 3.375 g (06/05/18 1352)  . potassium chloride    . vancomycin 200 mL/hr at 06/05/18 0600   PRN Meds:.sodium chloride, acetaminophen **OR** acetaminophen, diphenhydrAMINE **OR** diphenhydrAMINE, guaiFENesin-codeine, ipratropium-albuterol, naloxone **AND** sodium chloride flush, ondansetron **OR** [DISCONTINUED] ondansetron (ZOFRAN) IV, oxyCODONE, potassium chloride, traMADol, traZODone  Xrays Dg Chest Port 1 View  Result Date: 06/05/2018 CLINICAL DATA:  Surgery follow-up examination EXAM: PORTABLE CHEST 1 VIEW COMPARISON:  06/04/2018 FINDINGS: Two chest tubes on  the left are unchanged. No pneumothorax. Progressive left lower lobe consolidation. Small left effusion unchanged. Mild right lower lobe airspace disease has progressed. Central line in the SVC unchanged. IMPRESSION: Two chest tubes on the left with progressive left lower lobe consolidation. No pneumothorax. Progression of mild right lower lobe consolidation. Electronically Signed   By: Marlan Palauharles  Clark M.D.   On: 06/05/2018 09:33   Dg Chest Port 1 View  Result Date: 06/04/2018 CLINICAL DATA:  Surgery, follow-up examination. EXAM: PORTABLE CHEST 1 VIEW  COMPARISON:  June 03, 2018 FINDINGS: The mediastinal contour is stable. Heart size is enlarged. Right jugular central venous line is noted with distal tip in the superior vena cava. Left chest tubes are identified. There is a small left pleural effusion. Patchy consolidation of bilateral lung bases, left greater than right are noted. The visualized skeletal structures are stable. IMPRESSION: Left-sided chest tube is identified. No definite pleural line is noted to suggest pneumothorax. Patchy consolidation of both lung bases, left greater than right, underlying pneumonia especially on the left is not excluded. Small left pleural effusion. Electronically Signed   By: Sherian ReinWei-Chen  Lin M.D.   On: 06/04/2018 14:18   Results for orders placed or performed during the hospital encounter of 06/03/18  Blood culture (routine x 2)     Status: None (Preliminary result)   Collection Time: 06/03/18 11:29 AM  Result Value Ref Range Status   Specimen Description RIGHT ANTECUBITAL  Final   Special Requests   Final    BOTTLES DRAWN AEROBIC AND ANAEROBIC Blood Culture adequate volume   Culture   Final    NO GROWTH 2 DAYS Performed at [facility], [address] [place], [place] [address]    Report Status PENDING  Incomplete  Blood culture (routine x 2)     Status: None (Preliminary result)   Collection Time: 06/03/18 11:29 AM  Result Value Ref Range Status   Specimen Description BLOOD RIGHT WRIST  Final   Special Requests   Final    BOTTLES DRAWN AEROBIC AND ANAEROBIC Blood Culture adequate volume   Culture   Final    NO GROWTH 2 DAYS Performed at [facility], [address] [place], [place] [address]    Report Status PENDING  Incomplete  Acid Fast Smear (AFB)     Status: None   Collection Time: 06/03/18  1:58 PM  Result Value Ref Range Status   AFB Specimen Processing Concentration  Final   Acid Fast Smear Negative  Final    Comment: (NOTE) Performed At: [facility] [address]  [place], [place] [id number] Jolene SchimkeNagendra Sanjai MD EX:[id number]:[phone]    Source (AFB) PLEURAL  Final    Comment: LEFT Performed at [facility], [address] [place], [place] [address]   Gram stain     Status: None   Collection Time: 06/03/18  1:58 PM  Result Value Ref Range Status   Specimen Description PLEURAL LEFT  Final   Special Requests NONE  Final   Gram Stain   Final    CYTOSPIN SMEAR NO ORGANISMS SEEN WBC PRESENT,BOTH PMN AND MONONUCLEAR Performed at [facility], [address] [place], [place] [address]    Report Status 06/03/2018 FINAL  Final  Culture, body fluid-bottle     Status: Abnormal (Preliminary result)   Collection Time: 06/03/18  1:58 PM  Result Value Ref Range Status   Specimen Description   Final    PLEURAL LEFT Performed at [facility], [address] [place], [place]  [address]    Special Requests   Final    BOTTLES DRAWN AEROBIC AND ANAEROBIC 10CC Performed at [facility], [address], [place], [place] [address]    Gram Stain   Final    GRAM POSITIVE COCCI IN CLUSTERS WBC PRESENT, PREDOMINANTLY PMN BOTH ANA AND AEB BOTTLES Gram Stain Report Called to,Read Back By and Verified With: GRAHAM,A AT 0655 ON 06/04/18 BY HUFFINES,S. Performed at [facility], [address], [place], [place] [address]    Culture STAPHYLOCOCCUS AUREUS (A)  Final   Report Status PENDING  Incomplete  Aerobic Culture (superficial specimen)     Status: None (Preliminary result)   Collection Time: 06/03/18  5:23 PM  Result Value Ref Range Status   Specimen Description   Final    ABSCESS Performed at [facility], [address] [place], [place] [address]    Special Requests   Final    NONE Performed at [facility], [address], [place], [place] [address]    Gram Stain   Final    ABUNDANT WBC PRESENT, PREDOMINANTLY PMN RARE GRAM POSITIVE COCCI Performed at [facility], [address] [place], [place] [address]    Culture   Final    ABUNDANT STAPHYLOCOCCUS  AUREUS SUSCEPTIBILITIES TO FOLLOW FEW KLEBSIELLA PNEUMONIAE    Report Status PENDING  Incomplete   Organism ID, Bacteria KLEBSIELLA PNEUMONIAE  Final      Susceptibility   Klebsiella pneumoniae - MIC*    AMPICILLIN >=32 RESISTANT Resistant     CEFAZOLIN <=4 SENSITIVE Sensitive     CEFEPIME <=1 SENSITIVE Sensitive     CEFTAZIDIME <=1 SENSITIVE Sensitive     CEFTRIAXONE <=1 SENSITIVE Sensitive     CIPROFLOXACIN <=0.25 SENSITIVE Sensitive     GENTAMICIN <=1 SENSITIVE Sensitive     IMIPENEM <=0.25 SENSITIVE Sensitive     TRIMETH/SULFA <=20 SENSITIVE Sensitive     AMPICILLIN/SULBACTAM 4 SENSITIVE Sensitive     PIP/TAZO <=4 SENSITIVE Sensitive     Extended ESBL NEGATIVE Sensitive     * FEW KLEBSIELLA PNEUMONIAE  MRSA PCR Screening     Status: None   Collection Time: 06/04/18 10:01 AM  Result Value Ref Range Status   MRSA by PCR NEGATIVE NEGATIVE Final    Comment:        The GeneXpert MRSA Assay (FDA approved for NASAL specimens only), is one component of a comprehensive MRSA colonization surveillance program. It is not intended to diagnose MRSA infection nor to guide or monitor treatment for MRSA infections. Performed at [facility], [address], [place], [place] [address]   Aerobic Culture (superficial specimen)     Status: None (Preliminary result)   Collection Time: 06/04/18 11:51 AM  Result Value Ref Range Status   Specimen Description WOUND  Final   Special Requests NONE  Final   Gram Stain   Final    FEW WBC PRESENT,BOTH PMN AND MONONUCLEAR NO ORGANISMS SEEN RESULT CALLED TO, READ BACK BY AND VERIFIED WITH: Wynetta Fines MD 12:35 06/04/18 ([email or web address]) Performed at [facility], [address], [place], [place] [address]    Culture   Final    FEW STAPHYLOCOCCUS AUREUS RARE GRAM NEGATIVE RODS    Report Status PENDING  Incomplete  Culture, respiratory     Status: None (Preliminary result)   Collection Time: 06/04/18 12:02 PM  Result Value Ref Range Status    Specimen Description BRONCHIAL WASHINGS  Final   Special Requests NONE  Final   Gram Stain   Final    RARE WBC PRESENT, PREDOMINANTLY PMN FEW SQUAMOUS EPITHELIAL CELLS PRESENT FEW GRAM POSITIVE RODS RARE GRAM POSITIVE COCCI RARE GRAM NEGATIVE RODS    Culture   Final    CULTURE REINCUBATED FOR BETTER GROWTH Performed at [facility], [address] [place], [place] [address]    Report Status PENDING  Incomplete  Body fluid culture     Status: None (Preliminary result)   Collection Time: 06/04/18 12:31 PM  Result Value Ref Range Status   Specimen Description PLEURAL  Final   Special Requests LEFT  Final   Gram Stain   Final    FEW WBC PRESENT,BOTH PMN AND MONONUCLEAR MODERATE GRAM POSITIVE COCCI Performed at [facility], [address] [place], [place] [address]    Culture FEW STAPHYLOCOCCUS AUREUS  Final   Report Status PENDING  Incomplete  Aerobic/Anaerobic Culture (surgical/deep wound)     Status: None (Preliminary result)   Collection Time: 06/04/18 12:31 PM  Result Value Ref Range Status   Specimen Description PLEURAL  Final   Special Requests NONE  Final   Gram Stain   Final    ABUNDANT WBC PRESENT,BOTH PMN AND MONONUCLEAR RARE GRAM POSITIVE COCCI Performed at [facility], [address], [place], [place] [address]    Culture   Final    MODERATE STAPHYLOCOCCUS AUREUS NO ANAEROBES ISOLATED; CULTURE IN PROGRESS FOR 5 DAYS    Report Status PENDING  Incomplete   Assessment/Plan: S/P Procedure(s) (LRB): VIDEO BRONCHOSCOPY (N/A) Packing of Left Upper Arm Wound (Left) Left Video Assisted Thoracoscopy (Vats)/Decortication and Drainage of Empyema (Left)  1 s/p left vats for empyema, CXR show significant airspace consolidation, leave tubes in place, cont aggressive pulm toilet and current ABX- primarily growing Staph species. Cont nebs and wean O2 as able 2 leukocytosis trend improving 3 H/H pretty stable 4 good UOP- kidney fxn normal, reduce IVF  rate a leittle 5 echo- no evid of endocarditis 6 Ileus- limit narcotics as able- Check AXR in am- prior if exam worsens 7 medicine service assisting with medical management  LOS: 2 days    Rowe Clack [facility] 06/05/2018 Pager [phone]

## 2018-06-06 ENCOUNTER — Inpatient Hospital Stay (HOSPITAL_COMMUNITY): Payer: Self-pay

## 2018-06-06 LAB — AEROBIC CULTURE  (SUPERFICIAL SPECIMEN)

## 2018-06-06 LAB — CULTURE, RESPIRATORY W GRAM STAIN: Culture: NORMAL

## 2018-06-06 LAB — COMPREHENSIVE METABOLIC PANEL
ALT: 17 U/L (ref 0–44)
AST: 21 U/L (ref 15–41)
Albumin: 1.8 g/dL — ABNORMAL LOW (ref 3.5–5.0)
Alkaline Phosphatase: 78 U/L (ref 38–126)
Anion gap: 11 (ref 5–15)
BUN: 5 mg/dL — ABNORMAL LOW (ref 6–20)
CO2: 23 mmol/L (ref 22–32)
Calcium: 8 mg/dL — ABNORMAL LOW (ref 8.9–10.3)
Chloride: 97 mmol/L — ABNORMAL LOW (ref 98–111)
Creatinine, Ser: 0.7 mg/dL (ref 0.61–1.24)
GFR calc non Af Amer: >60 mL/min (ref >60)
Glucose, Bld: 99 mg/dL (ref 70–99)
POTASSIUM: 3.9 mmol/L (ref 3.5–5.1)
Sodium: 131 mmol/L — ABNORMAL LOW (ref 135–145)
Total Bilirubin: 0.7 mg/dL (ref 0.3–1.2)
Total Protein: 6 g/dL — ABNORMAL LOW (ref 6.5–8.1)

## 2018-06-06 LAB — BODY FLUID CULTURE

## 2018-06-06 LAB — AEROBIC CULTURE W GRAM STAIN (SUPERFICIAL SPECIMEN)

## 2018-06-06 LAB — CBC
HCT: 33.7 % — ABNORMAL LOW (ref 39.0–52.0)
Hemoglobin: 11 g/dL — ABNORMAL LOW (ref 13.0–17.0)
MCH: 29 pg (ref 26.0–34.0)
MCHC: 32.6 g/dL (ref 30.0–36.0)
MCV: 88.9 fL (ref 80.0–100.0)
Platelets: 441 10*3/uL — ABNORMAL HIGH (ref 150–400)
RBC: 3.79 MIL/uL — ABNORMAL LOW (ref 4.22–5.81)
RDW: 13.5 % (ref 11.5–15.5)
WBC: 15 10*3/uL — ABNORMAL HIGH (ref 4.0–10.5)
nRBC: 0 % (ref 0.0–0.2)

## 2018-06-06 LAB — CULTURE, BODY FLUID W GRAM STAIN -BOTTLE

## 2018-06-06 MED ORDER — IPRATROPIUM-ALBUTEROL 0.5-2.5 (3) MG/3ML IN SOLN
3.0000 mL | Freq: Two times a day (BID) | RESPIRATORY_TRACT | Status: DC
  Administered 2018-06-06 – 2018-06-07 (×4): 3 mL via RESPIRATORY_TRACT
  Filled 2018-06-06 (×4): qty 3

## 2018-06-06 NOTE — Progress Notes (Signed)
PROGRESS NOTE  Todd Hooper FIE:[id number] DOB: [birth year] DOA: 06/03/2018 PCP: Patient, No Pcp Per  Brief History   Todd Hooper is a52 y.o.malemedical history relevant for tobacco abuse, polysubstance abuse including IV drug use with cocaine presents to ED with left-sided chest discomfort shortness of breath of several days duration.  In ED---patient found to have WBC of 11.4 temp of 100.3,heart rate around 110 and respiratory rate around 30 CTA Chest -Sizable partially loculated pleural effusion on the left with multifocal infiltrates IR guided thoracentesis ordered in ED> fluid contained 7551 WBC, 96% neutrophils, LDH was 627 CT surgery called. The patient underwent a VATS on 06/04/2018 with placement of two drainage catheters. Pleural fluid culture has grown out MRSA.  A & P  Principal Problem: PNA (pneumonia)/Lt Sided Efusion/Empyema Abscess on left arm secondary to IV drug use Blood cultures negative thus far Strep pneumo antigen is negative S/P VATS 06/04/2018 and placement of drainage catheters x 2. S/P BAL  Gram stain was polymicrobial. Cultures have had no growth. Pleural fluid has grown out Staph aureus. MRSA positive. The patient is on on Zosyn and Vancomycin.  Continue aggressive pulmonary toilet.  Echocardiogram demonstrates EF of 55 - 60%. No evidence of endocarditis on TTE.   Active Problems: Hyponatremia: Resolved. Na 131 today. Monitor.  Polysubstance abuse -- IVDU (Cocaine and Fentanyl) He states that he plans on quitting  Tobacco abuse Continue Nicotine patch  DVT prophylaxis: SCD's Code Status: Full Code Family Communication: None at bedside. Colin [place]) [phone] Disposition Plan: tbd    Karie Kirks, DO [facility] Direct contact: see [email or web address]  7PM-7AM contact night coverage as above 06/06/2018, 11:54 AM  LOS: 3 days   Consultants  . Cardiothoracic Surgery  Procedures  . BAL, VATS  Antibiotics  . Vancomycin and  Zosyn  Interval History/Subjective  The patient complains of being sore. No other new complaints.  Objective   Vitals:  Vitals:   06/06/18 0943 06/06/18 1059  BP:  (!) 137/92  Pulse:  78  Resp:  19  Temp:  98.7 F (37.1 C)  SpO2: 98% 99%    Exam:  Constitutional:  . The patient is awake, alert, and oriented x 3. No acute distress. Respiratory:  . CTA bilaterally, no wheezes, rales, or rhonchi. . No tactile fremitus . No increased work of breathing. Cardiovascular:  . Regular rate and rhythm, no murmurs, ectopy, or gallups. . No LE extremity edema   . Normal pedal pulses Abdomen:  . Abdomen is soft, non-tender, non-distended. . No hernias, masses, or organomegaly. . Normoactive bowel sounds. Musculoskeletal:  . No cyanosis, clubbing or edema. Skin:  . No rashes, lesions, ulcers . palpation of skin: no induration or nodules Neurologic:  . CN 2-12 intact . Sensation all 4 extremities intact Psychiatric:  . Mental status o Mood, affect appropriate o Orientation to person, place, time  . judgment and insight appear intact   I have personally reviewed the following:   Today's Data  . CBC, BMP, Cultures, imaging.   Scheduled Meds: . acetaminophen  1,000 mg Oral Q6H   Or  . acetaminophen (TYLENOL) oral liquid 160 mg/5 mL  1,000 mg Oral Q6H  . bisacodyl  10 mg Oral Daily  . gabapentin  300 mg Oral TID  . guaiFENesin  600 mg Oral BID  . hydrOXYzine  25 mg Oral TID  . hydrOXYzine  50 mg Oral QHS  . ipratropium-albuterol  3 mL Nebulization BID  . methocarbamol  750 mg Oral Q6H  .  morphine   Intravenous Q4H  . multivitamin with minerals  1 tablet Oral Daily  . nicotine  21 mg Transdermal Daily  . polyethylene glycol  17 g Oral Daily  . senna  2 tablet Oral QHS  . senna-docusate  1 tablet Oral QHS  . sodium chloride flush  10-40 mL Intracatheter Q12H   Continuous Infusions: . sodium chloride    . sodium chloride 100 mL/hr at 06/05/18 2219  .  piperacillin-tazobactam (ZOSYN)  IV 3.375 g (06/06/18 0600)  . potassium chloride    . vancomycin 1,000 mg (06/06/18 0404)    Principal Problem:   PNA (pneumonia)/Lt Sided Efusion/Empyema Active Problems:   Polysubstance abuse -- IVDU (Cocaine)   Tobacco abuse   Empyema of left pleural space (HCC)   LOS: 3 days

## 2018-06-06 NOTE — Plan of Care (Signed)
  Problem: Activity: Goal: Ability to tolerate increased activity will improve Outcome: Progressing   Problem: Clinical Measurements: Goal: Ability to maintain a body temperature in the normal range will improve Outcome: Progressing   Problem: Education: Goal: Knowledge of General Education information will improve Description Including pain rating scale, medication(s)/side effects and non-pharmacologic comfort measures Outcome: Progressing

## 2018-06-06 NOTE — Progress Notes (Addendum)
      [address]       [place] [address]             [phone]      2 Days Post-Op Procedure(s) (LRB): VIDEO BRONCHOSCOPY (N/A) Packing of Left Upper Arm Wound (Left) Left Video Assisted Thoracoscopy (Vats)/Decortication and Drainage of Empyema (Left) Subjective: Feels okay. Pain is mostly controlled. Sleeping when I entered the room.   Objective: Vital signs in last 24 hours: Temp:  [97.7 F (36.5 C)-99.4 F (37.4 C)] 98.7 F (37.1 C) (02/23 1059) Pulse Rate:  [78-93] 78 (02/23 1059) Cardiac Rhythm: Normal sinus rhythm (02/23 0700) Resp:  [17-22] 19 (02/23 1059) BP: (122-150)/(85-101) 137/92 (02/23 1059) SpO2:  [96 %-99 %] 99 % (02/23 1059) Arterial Line BP: (158-168)/(76-77) 158/76 (02/22 1600) FiO2 (%):  [2 %] 2 % (02/22 2024)     Intake/Output from previous day: 02/22 0701 - 02/23 0700 In: 2494.3 [P.O.:360; I.V.:2134.3] Out: 4650 [Urine:4600; Chest Tube:50] Intake/Output this shift: Total I/O In: 250 [P.O.:240; I.V.:10] Out: 1400 [Urine:1400]  General appearance: cooperative and no distress Heart: regular rate and rhythm, S1, S2 normal, no murmur, click, rub or gallop Lungs: clear to auscultation bilaterally Abdomen: soft, non-tender; bowel sounds normal; no masses,  no organomegaly Extremities: extremities normal, atraumatic, no cyanosis or edema Wound: left arm is wrapped with a dressing. incisions c/d/i  Lab Results: Recent Labs    06/05/18 0528 06/06/18 0322  WBC 13.3* 15.0*  HGB 11.0* 11.0*  HCT 34.6* 33.7*  PLT 432* 441*   BMET:  Recent Labs    06/05/18 0528 06/06/18 0322  NA 135 131*  K 4.0 3.9  CL 102 97*  CO2 23 23  GLUCOSE 112* 99  BUN 7 5*  CREATININE 0.74 0.70  CALCIUM 7.8* 8.0*    PT/INR: No results for input(s): LABPROT, INR in the last 72 hours. ABG    Component Value Date/Time   PHART 7.467 (H) 06/05/2018 0450   HCO3 26.6 06/05/2018 0450   O2SAT 93.6 06/05/2018 0450   CBG (last 3)  No results for  input(s): GLUCAP in the last 72 hours.  Assessment/Plan: S/P Procedure(s) (LRB): VIDEO BRONCHOSCOPY (N/A) Packing of Left Upper Arm Wound (Left) Left Video Assisted Thoracoscopy (Vats)/Decortication and Drainage of Empyema (Left)  1. CV-NSR in the 80s, BP well controlled.  2. Pulm-tolerating room air with good oxygen saturation. Keep chest tubes.  3. Renal-creatinine 0.70, electrolytes ok 4. H and H 11.0/33.7, expected acute blood loss anemia 5. Cultures showed MRSA. Continue Vanc. No fevers. WBC 15.0 6. KUB ordered for abdominal pain and distension-results pending.   Plan: Keep chest tubes. Still has a central line and arterial line for now. Continue antibiotic treatment and medical treatment per attending.    LOS: 3 days    Sharlene Dory 06/06/2018   Chart reviewed, patient examined, agree with above. CXR ok. KUB shows gas throughout small and large intestine but non-specific and he has good bowel sounds. Just needs to pass it.

## 2018-06-06 NOTE — Progress Notes (Signed)
Alert and oriented  X 4. Out of bed to chair with 1 person assist. Vital signs remains stable (see flowsheet). Post op day 2 foley removed. Patient is no longer due to void. Voiding freely using urinal (clear yellow straw urine). Chest tubes x 2 to left lateral chest wall remain intact. Draining serosanguinous fluid. Pain management maintained with Morphine PCA pump and scheduled medications. Call bell within reach. Encouraged to report sign and symptoms to staff.

## 2018-06-07 ENCOUNTER — Inpatient Hospital Stay (HOSPITAL_COMMUNITY): Payer: Self-pay

## 2018-06-07 DIAGNOSIS — Z9689 Presence of other specified functional implants: Secondary | ICD-10-CM

## 2018-06-07 LAB — BASIC METABOLIC PANEL
Anion gap: 8 (ref 5–15)
BUN: 5 mg/dL — ABNORMAL LOW (ref 6–20)
CO2: 26 mmol/L (ref 22–32)
Calcium: 8.1 mg/dL — ABNORMAL LOW (ref 8.9–10.3)
Chloride: 100 mmol/L (ref 98–111)
Creatinine, Ser: 0.73 mg/dL (ref 0.61–1.24)
GFR calc Af Amer: >60 mL/min (ref >60)
GFR calc non Af Amer: >60 mL/min (ref >60)
Glucose, Bld: 130 mg/dL — ABNORMAL HIGH (ref 70–99)
POTASSIUM: 3.8 mmol/L (ref 3.5–5.1)
Sodium: 134 mmol/L — ABNORMAL LOW (ref 135–145)

## 2018-06-07 LAB — CBC WITH DIFFERENTIAL/PLATELET
ABS IMMATURE GRANULOCYTES: 0.23 10*3/uL — AB (ref 0.00–0.07)
Basophils Absolute: 0.1 10*3/uL (ref 0.0–0.1)
Basophils Relative: 0 %
Eosinophils Absolute: 0.5 10*3/uL (ref 0.0–0.5)
Eosinophils Relative: 4 %
HCT: 34.9 % — ABNORMAL LOW (ref 39.0–52.0)
Hemoglobin: 11.2 g/dL — ABNORMAL LOW (ref 13.0–17.0)
Immature Granulocytes: 2 %
Lymphocytes Relative: 17 %
Lymphs Abs: 2.2 10*3/uL (ref 0.7–4.0)
MCH: 28.6 pg (ref 26.0–34.0)
MCHC: 32.1 g/dL (ref 30.0–36.0)
MCV: 89.3 fL (ref 80.0–100.0)
Monocytes Absolute: 1.3 10*3/uL — ABNORMAL HIGH (ref 0.1–1.0)
Monocytes Relative: 10 %
NEUTROS PCT: 67 %
Neutro Abs: 8.5 10*3/uL — ABNORMAL HIGH (ref 1.7–7.7)
PLATELETS: 481 10*3/uL — AB (ref 150–400)
RBC: 3.91 MIL/uL — ABNORMAL LOW (ref 4.22–5.81)
RDW: 13.5 % (ref 11.5–15.5)
WBC: 12.7 10*3/uL — ABNORMAL HIGH (ref 4.0–10.5)
nRBC: 0 % (ref 0.0–0.2)

## 2018-06-07 LAB — VANCOMYCIN, TROUGH: Vancomycin Tr: 6 ug/mL — ABNORMAL LOW (ref 15–20)

## 2018-06-07 MED ORDER — VANCOMYCIN HCL IN DEXTROSE 1-5 GM/200ML-% IV SOLN
1000.0000 mg | Freq: Three times a day (TID) | INTRAVENOUS | Status: DC
  Administered 2018-06-07 – 2018-06-08 (×3): 1000 mg via INTRAVENOUS
  Filled 2018-06-07 (×5): qty 200

## 2018-06-07 MED ORDER — ACETAMINOPHEN 325 MG PO TABS
650.0000 mg | ORAL_TABLET | Freq: Four times a day (QID) | ORAL | Status: DC | PRN
  Administered 2018-06-07: 650 mg via ORAL
  Filled 2018-06-07: qty 2

## 2018-06-07 NOTE — Progress Notes (Signed)
Patients girl friend behavior is concerning. Girl friend goes out and comes back roughly every two to three hours staggering and incoherent. Argues with patient and girl friend seems unstable. Charge nurse made aware. Staff is making safety rounds. Patient denies any concerns or need. Will continue to monitor.

## 2018-06-07 NOTE — Progress Notes (Signed)
Patient ID: Todd Hooper, male   DOB: 08/22/1965, 53 y.o.   MRN: [id number] TCTS DAILY ICU PROGRESS NOTE                   [address]            [place] [address]          [phone]   3 Days Post-Op Procedure(s) (LRB): VIDEO BRONCHOSCOPY (N/A) Packing of Left Upper Arm Wound (Left) Left Video Assisted Thoracoscopy (Vats)/Decortication and Drainage of Empyema (Left)  Total Length of Stay:  LOS: 4 days   Subjective: Patient awake and alert good respiratory effort   Objective: Vital signs in last 24 hours: Temp:  [97.6 F (36.4 C)-99.3 F (37.4 C)] 99.3 F (37.4 C) (02/24 0533) Pulse Rate:  [78-108] 85 (02/24 0533) Cardiac Rhythm: Normal sinus rhythm;Bundle branch block (02/24 0500) Resp:  [15-24] 18 (02/24 0533) BP: (131-148)/(74-97) 148/97 (02/24 0533) SpO2:  [95 %-100 %] 100 % (02/24 0533) Arterial Line BP: (145-181)/(71-95) 181/95 (02/24 0533)  Filed Weights   06/03/18 0907 06/04/18 1023  Weight: 74.8 kg 74.8 kg    Weight change:    Hemodynamic parameters for last 24 hours:    Intake/Output from previous day: 02/23 0701 - 02/24 0700 In: 830 [P.O.:720; I.V.:10; IV Piggyback:100] Out: 3450 [Urine:3410; Chest Tube:40]  Intake/Output this shift: No intake/output data recorded.  Current Meds: Scheduled Meds: . acetaminophen  1,000 mg Oral Q6H   Or  . acetaminophen (TYLENOL) oral liquid 160 mg/5 mL  1,000 mg Oral Q6H  . bisacodyl  10 mg Oral Daily  . gabapentin  300 mg Oral TID  . guaiFENesin  600 mg Oral BID  . hydrOXYzine  25 mg Oral TID  . hydrOXYzine  50 mg Oral QHS  . ipratropium-albuterol  3 mL Nebulization BID  . methocarbamol  750 mg Oral Q6H  . morphine   Intravenous Q4H  . multivitamin with minerals  1 tablet Oral Daily  . nicotine  21 mg Transdermal Daily  . polyethylene glycol  17 g Oral Daily  . senna  2 tablet Oral QHS  . senna-docusate  1 tablet Oral QHS  . sodium chloride flush  10-40 mL Intracatheter Q12H   Continuous  Infusions: . sodium chloride    . sodium chloride 100 mL/hr at 06/05/18 2219  . piperacillin-tazobactam (ZOSYN)  IV 3.375 g (06/07/18 0526)  . potassium chloride    . vancomycin 1,000 mg (06/07/18 0401)   PRN Meds:.sodium chloride, acetaminophen **OR** acetaminophen, diphenhydrAMINE **OR** diphenhydrAMINE, guaiFENesin-codeine, ipratropium-albuterol, naloxone **AND** sodium chloride flush, ondansetron **OR** [DISCONTINUED] ondansetron (ZOFRAN) IV, oxyCODONE, potassium chloride, traMADol, traZODone  General appearance: alert, cooperative and no distress Neurologic: intact Heart: regular rate and rhythm, S1, S2 normal, no murmur, click, rub or gallop Lungs: diminished breath sounds bibasilar Abdomen: soft, non-tender; bowel sounds normal; no masses,  no organomegaly Extremities: extremities normal, atraumatic, no cyanosis or edema and Homans sign is negative, no sign of DVT Wound: intact no air leak Left arm site being packed   Lab Results: CBC: Recent Labs    06/06/18 0322 06/07/18 0305  WBC 15.0* 12.7*  HGB 11.0* 11.2*  HCT 33.7* 34.9*  PLT 441* 481*   BMET:  Recent Labs    06/06/18 0322 06/07/18 0305  NA 131* 134*  K 3.9 3.8  CL 97* 100  CO2 23 26  GLUCOSE 99 130*  BUN 5* 5*  CREATININE 0.70 0.73  CALCIUM 8.0* 8.1*  CMET: Lab Results  Component Value Date   WBC 12.7 (H) 06/07/2018   HGB 11.2 (L) 06/07/2018   HCT 34.9 (L) 06/07/2018   PLT 481 (H) 06/07/2018   GLUCOSE 130 (H) 06/07/2018   ALT 17 06/06/2018   AST 21 06/06/2018   NA 134 (L) 06/07/2018   K 3.8 06/07/2018   CL 100 06/07/2018   CREATININE 0.73 06/07/2018   BUN 5 (L) 06/07/2018   CO2 26 06/07/2018      PT/INR: No results for input(s): LABPROT, INR in the last 72 hours. Radiology: Dg Abd Acute 2+v W 1v Chest  Result Date: 06/06/2018 CLINICAL DATA:  Status post bronchoscopy. EXAM: DG ABDOMEN ACUTE W/ 1V CHEST COMPARISON:  06/05/2018 FINDINGS: Diffuse gaseous distension small and large bowel  in a nonspecific manner. No evidence of suspicious calcifications, or organomegaly. Enlarged cardiac silhouette. Stable appearance of right IJ venous sheath. Streaky airspace opacities in the left lower hemithorax with probable small left pleural effusion. IMPRESSION: 1. Diffuse gaseous distension of small and large bowel in a nonspecific pattern. 2. Streaky airspace opacities in the left lower hemithorax with probable small left pleural effusion. 3. Enlarged cardiac silhouette. Electronically Signed   By: Ted Mcalpine M.D.   On: 06/06/2018 14:00   Recent Results (from the past 240 hour(s))  Blood culture (routine x 2)     Status: None (Preliminary result)   Collection Time: 06/03/18 11:29 AM  Result Value Ref Range Status   Specimen Description RIGHT ANTECUBITAL  Final   Special Requests   Final    BOTTLES DRAWN AEROBIC AND ANAEROBIC Blood Culture adequate volume   Culture   Final    NO GROWTH 3 DAYS Performed at [facility], [address] [place], [place] [address]    Report Status PENDING  Incomplete  Blood culture (routine x 2)     Status: None (Preliminary result)   Collection Time: 06/03/18 11:29 AM  Result Value Ref Range Status   Specimen Description BLOOD RIGHT WRIST  Final   Special Requests   Final    BOTTLES DRAWN AEROBIC AND ANAEROBIC Blood Culture adequate volume   Culture   Final    NO GROWTH 3 DAYS Performed at [facility] [address], [place], [place] [address]    Report Status PENDING  Incomplete  Acid Fast Smear (AFB)     Status: None   Collection Time: 06/03/18  1:58 PM  Result Value Ref Range Status   AFB Specimen Processing Concentration  Final   Acid Fast Smear Negative  Final    Comment: (NOTE) Performed At: [facility] [address] [place], [place] [id number] Jolene Schimke MD YO:[id number]    Source (AFB) PLEURAL  Final    Comment: LEFT Performed at [facility], [address], [place], [place] [address]   Gram stain     Status:  None   Collection Time: 06/03/18  1:58 PM  Result Value Ref Range Status   Specimen Description PLEURAL LEFT  Final   Special Requests NONE  Final   Gram Stain   Final    CYTOSPIN SMEAR NO ORGANISMS SEEN WBC PRESENT,BOTH PMN AND MONONUCLEAR Performed at [facility], [address], [place], [place] [address]    Report Status 06/03/2018 FINAL  Final  Culture, body fluid-bottle     Status: Abnormal   Collection Time: 06/03/18  1:58 PM  Result Value Ref Range Status   Specimen Description   Final    PLEURAL LEFT Performed at  [facility], [address] [place], [place] [address]    Special Requests   Final    BOTTLES DRAWN AEROBIC AND ANAEROBIC 10CC Performed at [facility], [address], [place], [place] [address]    Gram Stain   Final    GRAM POSITIVE COCCI IN CLUSTERS WBC PRESENT, PREDOMINANTLY PMN BOTH ANA AND AEB BOTTLES Gram Stain Report Called to,Read Back By and Verified With: GRAHAM,A AT 0655 ON 06/04/18 BY HUFFINES,S. Performed at [facility], [address], [place], [place] [address]    Culture METHICILLIN RESISTANT STAPHYLOCOCCUS AUREUS (A)  Final   Report Status 06/06/2018 FINAL  Final   Organism ID, Bacteria METHICILLIN RESISTANT STAPHYLOCOCCUS AUREUS  Final      Susceptibility   Methicillin resistant staphylococcus aureus - MIC*    CIPROFLOXACIN >=8 RESISTANT Resistant     ERYTHROMYCIN >=8 RESISTANT Resistant     GENTAMICIN <=0.5 SENSITIVE Sensitive     OXACILLIN >=4 RESISTANT Resistant     TETRACYCLINE <=1 SENSITIVE Sensitive     VANCOMYCIN <=0.5 SENSITIVE Sensitive     TRIMETH/SULFA <=10 SENSITIVE Sensitive     CLINDAMYCIN >=8 RESISTANT Resistant     RIFAMPIN <=0.5 SENSITIVE Sensitive     Inducible Clindamycin NEGATIVE Sensitive     * METHICILLIN RESISTANT STAPHYLOCOCCUS AUREUS  Aerobic Culture (superficial specimen)     Status: None   Collection Time: 06/03/18  5:23 PM  Result Value Ref Range Status   Specimen Description   Final     ABSCESS Performed at [facility], [address] [place], [place] [address]    Special Requests   Final    NONE Performed at [facility], [address] [place], [place] [address]    Gram Stain   Final    ABUNDANT WBC PRESENT, PREDOMINANTLY PMN RARE GRAM POSITIVE COCCI Performed at [facility], [address], [place], [place] [address]    Culture   Final    ABUNDANT METHICILLIN RESISTANT STAPHYLOCOCCUS AUREUS FEW KLEBSIELLA PNEUMONIAE    Report Status 06/06/2018 FINAL  Final   Organism ID, Bacteria KLEBSIELLA PNEUMONIAE  Final   Organism ID, Bacteria METHICILLIN RESISTANT STAPHYLOCOCCUS AUREUS  Final      Susceptibility   Klebsiella pneumoniae - MIC*    AMPICILLIN >=32 RESISTANT Resistant     CEFAZOLIN <=4 SENSITIVE Sensitive     CEFEPIME <=1 SENSITIVE Sensitive     CEFTAZIDIME <=1 SENSITIVE Sensitive     CEFTRIAXONE <=1 SENSITIVE Sensitive     CIPROFLOXACIN <=0.25 SENSITIVE Sensitive     GENTAMICIN <=1 SENSITIVE Sensitive     IMIPENEM <=0.25 SENSITIVE Sensitive     TRIMETH/SULFA <=20 SENSITIVE Sensitive     AMPICILLIN/SULBACTAM 4 SENSITIVE Sensitive     PIP/TAZO <=4 SENSITIVE Sensitive     Extended ESBL NEGATIVE Sensitive     * FEW KLEBSIELLA PNEUMONIAE   Methicillin resistant staphylococcus aureus - MIC*    CIPROFLOXACIN >=8 RESISTANT Resistant     ERYTHROMYCIN >=8 RESISTANT Resistant     GENTAMICIN <=0.5 SENSITIVE Sensitive     OXACILLIN >=4 RESISTANT Resistant     TETRACYCLINE <=1 SENSITIVE Sensitive     VANCOMYCIN 1 SENSITIVE Sensitive     TRIMETH/SULFA <=10 SENSITIVE Sensitive     CLINDAMYCIN >=8 RESISTANT Resistant     RIFAMPIN <=0.5 SENSITIVE Sensitive     Inducible Clindamycin NEGATIVE Sensitive     * ABUNDANT METHICILLIN RESISTANT STAPHYLOCOCCUS AUREUS  MRSA PCR Screening     Status: None   Collection  Time: 06/04/18 10:01 AM  Result Value Ref Range Status   MRSA by PCR NEGATIVE NEGATIVE Final    Comment:        The GeneXpert MRSA Assay  (FDA approved for NASAL specimens only), is one component of a comprehensive MRSA colonization surveillance program. It is not intended to diagnose MRSA infection nor to guide or monitor treatment for MRSA infections. Performed at [facility], [address] [place], [place] [address]   Aerobic Culture (superficial specimen)     Status: None   Collection Time: 06/04/18 11:51 AM  Result Value Ref Range Status   Specimen Description WOUND  Final   Special Requests NONE  Final   Gram Stain   Final    FEW WBC PRESENT,BOTH PMN AND MONONUCLEAR NO ORGANISMS SEEN RESULT CALLED TO, READ BACK BY AND VERIFIED WITH: Wynetta FinesE. Trason Shifflet MD 12:35 06/04/18 ([email or web address]) Performed at [facility], [address] [place], [place] [address]    Culture   Final    FEW METHICILLIN RESISTANT STAPHYLOCOCCUS AUREUS RARE KLEBSIELLA PNEUMONIAE    Report Status 06/06/2018 FINAL  Final   Organism ID, Bacteria KLEBSIELLA PNEUMONIAE  Final   Organism ID, Bacteria METHICILLIN RESISTANT STAPHYLOCOCCUS AUREUS  Final      Susceptibility   Klebsiella pneumoniae - MIC*    AMPICILLIN >=32 RESISTANT Resistant     CEFAZOLIN <=4 SENSITIVE Sensitive     CEFEPIME <=1 SENSITIVE Sensitive     CEFTAZIDIME <=1 SENSITIVE Sensitive     CEFTRIAXONE <=1 SENSITIVE Sensitive     CIPROFLOXACIN <=0.25 SENSITIVE Sensitive     GENTAMICIN <=1 SENSITIVE Sensitive     IMIPENEM <=0.25 SENSITIVE Sensitive     TRIMETH/SULFA <=20 SENSITIVE Sensitive     AMPICILLIN/SULBACTAM 8 SENSITIVE Sensitive     PIP/TAZO <=4 SENSITIVE Sensitive     Extended ESBL NEGATIVE Sensitive     * RARE KLEBSIELLA PNEUMONIAE   Methicillin resistant staphylococcus aureus - MIC*    CIPROFLOXACIN >=8 RESISTANT Resistant     ERYTHROMYCIN >=8 RESISTANT Resistant     GENTAMICIN <=0.5 SENSITIVE Sensitive     OXACILLIN RESISTANT Resistant     TETRACYCLINE <=1 SENSITIVE Sensitive     VANCOMYCIN <=0.5 SENSITIVE Sensitive     TRIMETH/SULFA <=10 SENSITIVE  Sensitive     CLINDAMYCIN >=8 RESISTANT Resistant     RIFAMPIN <=0.5 SENSITIVE Sensitive     Inducible Clindamycin NEGATIVE Sensitive     * FEW METHICILLIN RESISTANT STAPHYLOCOCCUS AUREUS  Culture, respiratory     Status: None   Collection Time: 06/04/18 12:02 PM  Result Value Ref Range Status   Specimen Description BRONCHIAL WASHINGS  Final   Special Requests NONE  Final   Gram Stain   Final    RARE WBC PRESENT, PREDOMINANTLY PMN FEW SQUAMOUS EPITHELIAL CELLS PRESENT FEW GRAM POSITIVE RODS RARE GRAM POSITIVE COCCI RARE GRAM NEGATIVE RODS    Culture   Final    FEW Consistent with normal respiratory flora. Performed at [facility], [address] [place], [place] [address]    Report Status 06/06/2018 FINAL  Final  Body fluid culture     Status: None   Collection Time: 06/04/18 12:31 PM  Result Value Ref Range Status   Specimen Description PLEURAL  Final   Special Requests LEFT  Final   Gram Stain   Final    FEW WBC PRESENT,BOTH PMN AND MONONUCLEAR MODERATE GRAM POSITIVE COCCI Performed at [facility], [address] [place],  [place] [address]    Culture FEW METHICILLIN RESISTANT STAPHYLOCOCCUS AUREUS  Final   Report Status 06/06/2018 FINAL  Final   Organism ID, Bacteria METHICILLIN RESISTANT STAPHYLOCOCCUS AUREUS  Final      Susceptibility   Methicillin resistant staphylococcus aureus - MIC*    CIPROFLOXACIN >=8 RESISTANT Resistant     ERYTHROMYCIN >=8 RESISTANT Resistant     GENTAMICIN <=0.5 SENSITIVE Sensitive     OXACILLIN >=4 RESISTANT Resistant     TETRACYCLINE <=1 SENSITIVE Sensitive     VANCOMYCIN <=0.5 SENSITIVE Sensitive     TRIMETH/SULFA <=10 SENSITIVE Sensitive     CLINDAMYCIN >=8 RESISTANT Resistant     RIFAMPIN <=0.5 SENSITIVE Sensitive     Inducible Clindamycin NEGATIVE Sensitive     * FEW METHICILLIN RESISTANT STAPHYLOCOCCUS AUREUS  Aerobic/Anaerobic Culture (surgical/deep wound)     Status: None (Preliminary result)   Collection Time:  06/04/18 12:31 PM  Result Value Ref Range Status   Specimen Description PLEURAL  Final   Special Requests NONE  Final   Gram Stain   Final    ABUNDANT WBC PRESENT,BOTH PMN AND MONONUCLEAR RARE GRAM POSITIVE COCCI Performed at [facility], [address], [place], [place] [address]    Culture   Final    MODERATE METHICILLIN RESISTANT STAPHYLOCOCCUS AUREUS NO ANAEROBES ISOLATED; CULTURE IN PROGRESS FOR 5 DAYS    Report Status PENDING  Incomplete   Organism ID, Bacteria METHICILLIN RESISTANT STAPHYLOCOCCUS AUREUS  Final      Susceptibility   Methicillin resistant staphylococcus aureus - MIC*    CIPROFLOXACIN >=8 RESISTANT Resistant     ERYTHROMYCIN >=8 RESISTANT Resistant     GENTAMICIN <=0.5 SENSITIVE Sensitive     OXACILLIN RESISTANT Resistant     TETRACYCLINE <=1 SENSITIVE Sensitive     VANCOMYCIN <=0.5 SENSITIVE Sensitive     TRIMETH/SULFA <=10 SENSITIVE Sensitive     CLINDAMYCIN >=8 RESISTANT Resistant     RIFAMPIN <=0.5 SENSITIVE Sensitive     Inducible Clindamycin NEGATIVE Sensitive     * MODERATE METHICILLIN RESISTANT STAPHYLOCOCCUS AUREUS    Assessment/Plan: S/P Procedure(s) (LRB): VIDEO BRONCHOSCOPY (N/A) Packing of Left Upper Arm Wound (Left) Left Video Assisted Thoracoscopy (Vats)/Decortication and Drainage of Empyema (Left) Mobilize d/c tubes/lines- d/c aline and right IJ Continue antibiotics for SA - methicillin resistant     Delight Ovens 06/07/2018 7:10 AM

## 2018-06-07 NOTE — Care Management Note (Signed)
Case Management Note  Patient Details  Name: Todd Hooper MRN: [id number] Date of Birth: [birth year]  Subjective/Objective:    Pt admitted with empyema and left arm abscess secondary to IV DA.                    Action/Plan:  PTA from home.  Pt has current substance abuse.  Pt on IV antibiotics and will remain in house until antibiotic regimen is complete.  CSW consulted for substance abuse.  CM will continue to follow for discharge needs   Expected Discharge Date:                  Expected Discharge Plan:  Home/Self Care  In-House Referral:  Clinical Social Work  Discharge planning Services  CM Consult  Post Acute Care Choice:    Choice offered to:     DME Arranged:    DME Agency:     HH Arranged:    HH Agency:     Status of Service:     If discussed at [employer], dates discussed:    Additional Comments:  Cherylann Parr, RN 06/07/2018, 9:25 AM

## 2018-06-07 NOTE — Consult Note (Addendum)
WOC consult requested for arm wound topical treatment orders by nursing staff.  Reviewed the EMR and patient is being followed by the CT surgical team for assessment and plan of care and had surgery performed to this site.  Please refer to their team for further questions. Please re-consult if further assistance is needed.  Thank-you,  Cammie Mcgee MSN, RN, CWOCN, [place], CNS [phone]

## 2018-06-07 NOTE — Op Note (Signed)
NAMEJAXIEL, KOTALIK MEDICAL RECORD [id number] ACCOUNT [id number] DATE OF BIRTH:January 22, 1966 FACILITY: [facility] LOCATION: [facility] PHYSICIAN:Onofre Gains Bari Brianna Esson, MD  OPERATIVE REPORT  DATE OF PROCEDURE:  06/04/2018  PREOPERATIVE DIAGNOSIS:  Left empyema.  POSTOPERATIVE DIAGNOSIS:  Left empyema.  SURGICAL PROCEDURE:  Bronchoscopy, left video-assisted thoracoscopy, drainage of empyema and decortication, packing of open left upper arm infected IV site.  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  Chest x-ray and CT scan confirmed  empyema, left chest.  The patient was transferred to [facility] for further treatment.  Prior to his transfer to [facility], a left thoracentesis was attempted but was unsuccessful, draining only 95 mL of a complex effusion.  Proceeding quickly with drainage and  decortication was recommended to the patient, who agreed and signed informed consent.  DESCRIPTION OF PROCEDURE:  The patient underwent general endotracheal anesthesia without incident.  Appropriate time-out was performed through a single-lumen endotracheal tube.  A fiberoptic bronchoscope was passed to the subsegmental level in both right  and left tracheobronchial tree.  There were no obvious endobronchial lesions noted, scant secretions.  Washings of the left lower lobe were performed and submitted for culture and pathology and cytology.  The scope was then removed, and a double-lumen  endotracheal tube was placed.  Prior to turning the patient, there was an inflamed puncture wound in the left upper arm.  With the patient asleep, this was examined more fully.  We prepped it with Betadine, cultured it, and packed this open wound with  iodoform gauze.  We then turned the patient in lateral  decubitus position with the left side up.  Left chest was prepped with Betadine, draped in a sterile manner.  We then made a small incision, entering the left chest.  There were complex loculated  effusions throughout the left chest.  Through the small incision and with the assistance of the videoscope, we were able to break up the adhesions and loculations and drain the effusion.  With the scope removed, the fibrous peel, which was around the  lower portion of the lung, portions of the fluid and decortication material were sent to histology and also for culture.  With the loculations well drained, 2 Blake drains were placed into the left chest, 1 anteriorly and 1 posteriorly.  The incision was  then closed with interrupted Vicryl sutures 0, running 2-0 Vicryl in subcutaneous tissue, and 3-0 subcuticular in the skin edges.  Dry dressings were applied.  The lung reinflated nicely.  There was no air leak at the completion of the procedure.   Sponge and needle count was reported as correct at the completion of the procedure.  RF tag scanning reported clear code.  The patient was then awakened and extubated in the operating room and transferred to the recovery room for postoperative care.  ESTIMATED BLOOD LOSS:  150-200 mL.  LN/NUANCE  D:06/07/2018 T:06/07/2018 JOB:[id number]

## 2018-06-07 NOTE — Progress Notes (Addendum)
Physician notified: Sharon Seller At: 29  Regarding: FYI swab from LUE positive for MRSA. Placed in isolation, on vanc and zosyn. No orders for dressing change/wound care, OK to place order for WOCN?

## 2018-06-07 NOTE — Progress Notes (Signed)
Spoke with RN Lequita Halt, and she is aware of the DC central line order

## 2018-06-07 NOTE — Progress Notes (Signed)
[facility] TEAM 1 - Stepdown/ICU TEAM  Shellie Gell  ZOX:[id number] DOB: 1965/12/24 DOA: 06/03/2018 PCP: Patient, No Pcp Per    Brief Narrative:  53yo M w/a hx of tobacco abuse, IV drug abuse, and cocaine abuse who presented with several days of L chest discomfort and SOB.  In the ED he was found to have a WBC of 11.4, temp of 100.3, HR 110, and RR 30. CTa chest noted a sizable L partially loculated pleural effusion with multifocal infiltrates. An IR guided thoracentesis noted fluid w/ 7551 WBC (96% neutrophils) and LDH 627. The patient underwent a VATS on 06/04/2018 with placement of two drainage catheters. Pleural fluid culture has grown out MRSA.  Subjective: Pt denies any new complaints. States his pain is well controlled. Denies n/v, sob, abdom pain, or constipation.   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 rate 18, height  (1.727 m), weight 74.8 kg, SpO2 96 %.  Intake/Output Summary (Last 24 hours) at 06/07/2018 0904 Last data filed at 06/07/2018 0528 Gross per 24 hour  Intake 590 ml  Output 2650 ml  Net -2060 ml   Filed Weights   06/03/18 0907 06/04/18 1023  Weight: 74.8 kg 74.8 kg    Examination: General: No acute respiratory distress Lungs: poor air movement L base - no wheezing  Cardiovascular: RRR - no M appreciated  Abdomen: Nontender, nondistended, soft, bowel  sounds positive, no rebound, no ascites, no appreciable mass Extremities: No significant cyanosis, clubbing, or edema bilateral lower extremities  CBC: Recent Labs  Lab 06/05/18 0528 06/06/18 0322 06/07/18 0305  WBC 13.3* 15.0* 12.7*  NEUTROABS  --   --  8.5*  HGB 11.0* 11.0* 11.2*  HCT 34.6* 33.7* 34.9*  MCV 88.9 88.9 89.3  PLT 432* 441* 481*   Basic Metabolic Panel: Recent Labs  Lab 06/05/18 0528 06/06/18 0322 06/07/18 0305  NA 135 131* 134*  K 4.0 3.9 3.8  CL 102 97* 100  CO2 GLUCOSE 112* 99 130*  BUN 7 5* 5*  CREATININE 0.74 0.70 0.73  CALCIUM 7.8* 8.0* 8.1*   GFR: Estimated Creatinine Clearance: 104.5 mL/min (by C-G formula based on SCr of 0.73 mg/dL).  Liver Function Tests: Recent Labs  Lab 06/03/18 1558 06/06/18 0322  AST  --  21  ALT  --  17  ALKPHOS  --  78  BILITOT  --  0.7  PROT 6.2* 6.0*  ALBUMIN  --  1.8*    Cardiac Enzymes: Recent Labs  Lab 06/03/18 0926  TROPONINI <0.03     Recent Results (from the past 240 hour(s))  Blood culture (routine x 2)     Status: None (Preliminary result)   Collection Time: 06/03/18 11:29 AM  Result Value Ref Range Status   Specimen Description RIGHT ANTECUBITAL  Final   Special Requests   Final    BOTTLES DRAWN  AEROBIC AND ANAEROBIC Blood Culture adequate volume   Culture   Final    NO GROWTH 4 DAYS Performed at [facility], [address] [place], [place] [address]    Report Status PENDING  Incomplete  Blood culture (routine x 2)     Status: None (Preliminary result)   Collection Time: 06/03/18 11:29 AM  Result Value Ref Range Status   Specimen Description BLOOD RIGHT WRIST  Final   Special Requests   Final    BOTTLES DRAWN AEROBIC AND ANAEROBIC Blood Culture adequate volume   Culture   Final    NO GROWTH 4 DAYS Performed at [facility], [address] [place], [place] [address]    Report Status PENDING  Incomplete  Fungus Culture With Stain     Status: None (Preliminary result)    Collection Time: 06/03/18  1:58 PM  Result Value Ref Range Status   Fungus Stain Final report  Final    Comment: (NOTE) Performed At: [facility] [address] [place], [place] [id number] Jolene SchimkeNagendra Sanjai MD GE:[id number]:[phone]    Fungus (Mycology) Culture PENDING  Incomplete   Fungal Source PLEURAL  Final    Comment: LEFT Performed at [facility], [address] [place], [place] [address]   Acid Fast Smear (AFB)     Status: None   Collection Time: 06/03/18  1:58 PM  Result Value Ref Range Status   AFB Specimen Processing Concentration  Final   Acid Fast Smear Negative  Final    Comment: (NOTE) Performed At: [facility] [address] [place], [place] [id number] Jolene SchimkeNagendra Sanjai MD KV:[id number]:[phone]    Source (AFB) PLEURAL  Final    Comment: LEFT Performed at [facility], [address] [place], [place] [address]   Gram stain     Status: None   Collection Time: 06/03/18  1:58 PM  Result Value Ref Range Status   Specimen Description PLEURAL LEFT  Final   Special Requests NONE  Final   Gram Stain   Final    CYTOSPIN SMEAR NO ORGANISMS SEEN WBC PRESENT,BOTH PMN AND MONONUCLEAR Performed at [facility], [address] [place], [place] [address]    Report Status 06/03/2018 FINAL  Final  Culture, body fluid-bottle     Status: Abnormal   Collection Time: 06/03/18  1:58 PM  Result Value Ref Range Status   Specimen Description   Final    PLEURAL LEFT Performed at [facility], [address] [place], [place] [address]    Special Requests   Final    BOTTLES DRAWN AEROBIC AND ANAEROBIC 10CC Performed at [facility], [address] [place], [place] [address]    Gram Stain   Final    GRAM POSITIVE COCCI IN CLUSTERS WBC PRESENT, PREDOMINANTLY PMN BOTH ANA AND AEB BOTTLES Gram Stain Report Called to,Read Back By and Verified With: GRAHAM,A AT 0655 ON 06/04/18 BY HUFFINES,S. Performed at [facility], [address] [place], [place] [address]    Culture  METHICILLIN RESISTANT STAPHYLOCOCCUS AUREUS (A)  Final   Report Status 06/06/2018 FINAL  Final   Organism ID, Bacteria METHICILLIN RESISTANT STAPHYLOCOCCUS AUREUS  Final      Susceptibility   Methicillin resistant staphylococcus aureus - MIC*    CIPROFLOXACIN >=8 RESISTANT Resistant     ERYTHROMYCIN >=8 RESISTANT Resistant     GENTAMICIN <=0.5 SENSITIVE Sensitive     OXACILLIN >=4 RESISTANT Resistant     TETRACYCLINE <=1 SENSITIVE Sensitive     VANCOMYCIN <=0.5 SENSITIVE  Sensitive     TRIMETH/SULFA <=10 SENSITIVE Sensitive     CLINDAMYCIN >=8 RESISTANT Resistant     RIFAMPIN <=0.5 SENSITIVE Sensitive     Inducible Clindamycin NEGATIVE Sensitive     * METHICILLIN RESISTANT STAPHYLOCOCCUS AUREUS  Fungus Culture Result     Status: None   Collection Time: 06/03/18  1:58 PM  Result Value Ref Range Status   Result 1 Comment  Final    Comment: (NOTE) KOH/Calcofluor preparation:  no fungus observed. Performed At: [facility] [address] [place], [place] [id number] Jolene Schimke MD PP:[id number]   Aerobic Culture (superficial specimen)     Status: None   Collection Time: 06/03/18  5:23 PM  Result Value Ref Range Status   Specimen Description   Final    ABSCESS Performed at [facility], [address], [place], [place] [address]    Special Requests   Final    NONE Performed at [facility], [address] [place], [place] [address]    Gram Stain   Final    ABUNDANT WBC PRESENT, PREDOMINANTLY PMN RARE GRAM POSITIVE COCCI Performed at [facility], [address] [place], [place] [address]    Culture   Final    ABUNDANT METHICILLIN RESISTANT STAPHYLOCOCCUS AUREUS FEW KLEBSIELLA PNEUMONIAE    Report Status 06/06/2018 FINAL  Final   Organism ID, Bacteria KLEBSIELLA PNEUMONIAE  Final   Organism ID, Bacteria METHICILLIN RESISTANT STAPHYLOCOCCUS AUREUS  Final      Susceptibility   Klebsiella pneumoniae - MIC*    AMPICILLIN >=32 RESISTANT Resistant     CEFAZOLIN  <=4 SENSITIVE Sensitive     CEFEPIME <=1 SENSITIVE Sensitive     CEFTAZIDIME <=1 SENSITIVE Sensitive     CEFTRIAXONE <=1 SENSITIVE Sensitive     CIPROFLOXACIN <=0.25 SENSITIVE Sensitive     GENTAMICIN <=1 SENSITIVE Sensitive     IMIPENEM <=0.25 SENSITIVE Sensitive     TRIMETH/SULFA <=20 SENSITIVE Sensitive     AMPICILLIN/SULBACTAM 4 SENSITIVE Sensitive     PIP/TAZO <=4 SENSITIVE Sensitive     Extended ESBL NEGATIVE Sensitive     * FEW KLEBSIELLA PNEUMONIAE   Methicillin resistant staphylococcus aureus - MIC*    CIPROFLOXACIN >=8 RESISTANT Resistant     ERYTHROMYCIN >=8 RESISTANT Resistant     GENTAMICIN <=0.5 SENSITIVE Sensitive     OXACILLIN >=4 RESISTANT Resistant     TETRACYCLINE <=1 SENSITIVE Sensitive     VANCOMYCIN 1 SENSITIVE Sensitive     TRIMETH/SULFA <=10 SENSITIVE Sensitive     CLINDAMYCIN >=8 RESISTANT Resistant     RIFAMPIN <=0.5 SENSITIVE Sensitive     Inducible Clindamycin NEGATIVE Sensitive     * ABUNDANT METHICILLIN RESISTANT STAPHYLOCOCCUS AUREUS  MRSA PCR Screening     Status: None   Collection Time: 06/04/18 10:01 AM  Result Value Ref Range Status   MRSA by PCR NEGATIVE NEGATIVE Final    Comment:        The GeneXpert MRSA Assay (FDA approved for NASAL specimens only), is one component of a comprehensive MRSA colonization surveillance program. It is not intended to diagnose MRSA infection nor to guide or monitor treatment for MRSA infections. Performed at [facility], [address] [place], [place] [address]   Aerobic Culture (superficial specimen)     Status: None   Collection Time: 06/04/18 11:51 AM  Result Value Ref Range Status   Specimen Description WOUND  Final   Special Requests NONE  Final   Gram Stain  Final    FEW WBC PRESENT,BOTH PMN AND MONONUCLEAR NO ORGANISMS SEEN RESULT CALLED TO, READ BACK BY AND VERIFIED WITH: Wynetta Fines MD 12:35 06/04/18 ([email or web address]) Performed at [facility], [address] [place], [place]  [address]    Culture   Final    FEW METHICILLIN RESISTANT STAPHYLOCOCCUS AUREUS RARE KLEBSIELLA PNEUMONIAE    Report Status 06/06/2018 FINAL  Final   Organism ID, Bacteria KLEBSIELLA PNEUMONIAE  Final   Organism ID, Bacteria METHICILLIN RESISTANT STAPHYLOCOCCUS AUREUS  Final      Susceptibility   Klebsiella pneumoniae - MIC*    AMPICILLIN >=32 RESISTANT Resistant     CEFAZOLIN <=4 SENSITIVE Sensitive     CEFEPIME <=1 SENSITIVE Sensitive     CEFTAZIDIME <=1 SENSITIVE Sensitive     CEFTRIAXONE <=1 SENSITIVE Sensitive     CIPROFLOXACIN <=0.25 SENSITIVE Sensitive     GENTAMICIN <=1 SENSITIVE Sensitive     IMIPENEM <=0.25 SENSITIVE Sensitive     TRIMETH/SULFA <=20 SENSITIVE Sensitive     AMPICILLIN/SULBACTAM 8 SENSITIVE Sensitive     PIP/TAZO <=4 SENSITIVE Sensitive     Extended ESBL NEGATIVE Sensitive     * RARE KLEBSIELLA PNEUMONIAE   Methicillin resistant staphylococcus aureus - MIC*    CIPROFLOXACIN >=8 RESISTANT Resistant     ERYTHROMYCIN >=8 RESISTANT Resistant     GENTAMICIN <=0.5 SENSITIVE Sensitive     OXACILLIN RESISTANT Resistant     TETRACYCLINE <=1 SENSITIVE Sensitive     VANCOMYCIN <=0.5 SENSITIVE Sensitive     TRIMETH/SULFA <=10 SENSITIVE Sensitive     CLINDAMYCIN >=8 RESISTANT Resistant     RIFAMPIN <=0.5 SENSITIVE Sensitive     Inducible Clindamycin NEGATIVE Sensitive     * FEW METHICILLIN RESISTANT STAPHYLOCOCCUS AUREUS  Culture, respiratory     Status: None   Collection Time: 06/04/18 12:02 PM  Result Value Ref Range Status   Specimen Description BRONCHIAL WASHINGS  Final   Special Requests NONE  Final   Gram Stain   Final    RARE WBC PRESENT, PREDOMINANTLY PMN FEW SQUAMOUS EPITHELIAL CELLS PRESENT FEW GRAM POSITIVE RODS RARE GRAM POSITIVE COCCI RARE GRAM NEGATIVE RODS    Culture   Final    FEW Consistent with normal respiratory flora. Performed at [facility], [address], [place], [place] [address]    Report Status 06/06/2018 FINAL  Final    Body fluid culture     Status: None   Collection Time: 06/04/18 12:31 PM  Result Value Ref Range Status   Specimen Description PLEURAL  Final   Special Requests LEFT  Final   Gram Stain   Final    FEW WBC PRESENT,BOTH PMN AND MONONUCLEAR MODERATE GRAM POSITIVE COCCI Performed at [facility], [address], [place], [place] [address]    Culture FEW METHICILLIN RESISTANT STAPHYLOCOCCUS AUREUS  Final   Report Status 06/06/2018 FINAL  Final   Organism ID, Bacteria METHICILLIN RESISTANT STAPHYLOCOCCUS AUREUS  Final      Susceptibility   Methicillin resistant staphylococcus aureus - MIC*    CIPROFLOXACIN >=8 RESISTANT Resistant     ERYTHROMYCIN >=8 RESISTANT Resistant     GENTAMICIN <=0.5 SENSITIVE Sensitive     OXACILLIN >=4 RESISTANT Resistant     TETRACYCLINE <=1 SENSITIVE Sensitive     VANCOMYCIN <=0.5 SENSITIVE Sensitive     TRIMETH/SULFA <=10 SENSITIVE Sensitive     CLINDAMYCIN >=8 RESISTANT Resistant     RIFAMPIN <=0.5 SENSITIVE Sensitive     Inducible Clindamycin  NEGATIVE Sensitive     * FEW METHICILLIN RESISTANT STAPHYLOCOCCUS AUREUS  Aerobic/Anaerobic Culture (surgical/deep wound)     Status: None (Preliminary result)   Collection Time: 06/04/18 12:31 PM  Result Value Ref Range Status   Specimen Description PLEURAL  Final   Special Requests NONE  Final   Gram Stain   Final    ABUNDANT WBC PRESENT,BOTH PMN AND MONONUCLEAR RARE GRAM POSITIVE COCCI Performed at [facility], [address], [place], [place] [address]    Culture   Final    MODERATE METHICILLIN RESISTANT STAPHYLOCOCCUS AUREUS NO ANAEROBES ISOLATED; CULTURE IN PROGRESS FOR 5 DAYS    Report Status PENDING  Incomplete   Organism ID, Bacteria METHICILLIN RESISTANT STAPHYLOCOCCUS AUREUS  Final      Susceptibility   Methicillin resistant staphylococcus aureus - MIC*    CIPROFLOXACIN >=8 RESISTANT Resistant     ERYTHROMYCIN >=8 RESISTANT Resistant     GENTAMICIN <=0.5 SENSITIVE Sensitive      OXACILLIN RESISTANT Resistant     TETRACYCLINE <=1 SENSITIVE Sensitive     VANCOMYCIN <=0.5 SENSITIVE Sensitive     TRIMETH/SULFA <=10 SENSITIVE Sensitive     CLINDAMYCIN >=8 RESISTANT Resistant     RIFAMPIN <=0.5 SENSITIVE Sensitive     Inducible Clindamycin NEGATIVE Sensitive     * MODERATE METHICILLIN RESISTANT STAPHYLOCOCCUS AUREUS     Scheduled Meds: . acetaminophen  1,000 mg Oral Q6H   Or  . acetaminophen (TYLENOL) oral liquid 160 mg/5 mL  1,000 mg Oral Q6H  . bisacodyl  10 mg Oral Daily  . gabapentin  300 mg Oral TID  . guaiFENesin  600 mg Oral BID  . hydrOXYzine  25 mg Oral TID  . hydrOXYzine  50 mg Oral QHS  . ipratropium-albuterol  3 mL Nebulization BID  . methocarbamol  750 mg Oral Q6H  . morphine   Intravenous Q4H  . multivitamin with minerals  1 tablet Oral Daily  . nicotine  21 mg Transdermal Daily  . polyethylene glycol  17 g Oral Daily  . senna  2 tablet Oral QHS  . senna-docusate  1 tablet Oral QHS  . sodium chloride flush  10-40 mL Intracatheter Q12H   Continuous Infusions: . sodium chloride    . sodium chloride 100 mL/hr at 06/05/18 2219  . piperacillin-tazobactam (ZOSYN)  IV 3.375 g (06/07/18 0526)  . potassium chloride    . vancomycin 1,000 mg (06/07/18 0401)     LOS: 4 days   Lonia Blood, MD [facility] Office  [phone] Pager - Text Page per Amion  If 7PM-7AM, please contact night-coverage per Amion 06/07/2018, 9:04 AM

## 2018-06-07 NOTE — Progress Notes (Signed)
Anterior chest tube pulled as per order, patient tolerated well. Posterior chest tube remains to suction -20cm as per order. Will monitor.

## 2018-06-07 NOTE — Progress Notes (Signed)
Pharmacy Antibiotic Note  Todd Hooper is a 53 y.o. male admitted on 06/03/2018 with sepsis/lung abscess. Pharmacy has been consulted for vanc dosing.  WBC 12.7, afebrile. Scr stable at 0.73 (CrCl >100 mL/min). Vancomycin trough this morning is subtherapeutic at 6, approximately 12 hours after last dose.   Plan: Change vancomycin to 1000 mg every 8 hours. Zosyn 3.375g IV every 8 hours. Monitor labs, c/s, and vanco levels as indicated.  Height: 5\' 8"  (172.7 cm) Weight: 164 lb 14.5 oz (74.8 kg) IBW/kg (Calculated) : 68.4  Temp (24hrs), Avg:98.9 F (37.2 C), Min:97.6 F (36.4 C), Max:99.7 F (37.6 C)  Recent Labs  Lab 06/03/18 0926 06/03/18 1129 06/04/18 0812 06/05/18 0528 06/06/18 0322 06/07/18 0305  WBC  --   --  22.1* 13.3* 15.0* 12.7*  CREATININE 0.75  --  0.76 0.74 0.70 0.73  LATICACIDVEN  --  0.9  --   --   --   --   VANCOTROUGH  --   --   --   --   --  6*    Estimated Creatinine Clearance: 104.5 mL/min (by C-G formula based on SCr of 0.73 mg/dL).    No Known Allergies  Antimicrobials this admission: Zosyn 2/20 >>  Vanco 2/20 >>   Dose adjustments this admission: 2/24 VT: 6 (12 hours after last dose) >> change to 1 g IV q8 hr  Microbiology results: 2/20 BCx: NGTD 2/20 L pleural: no orgs 2/20 L pleural fluid: MRSA  2/21 Wound: MRSA + kleb (R amp) 2/21 TA: norm flora  Thank you for allowing pharmacy to be a part of this patient's care.  Sherron Monday, PharmD, BCCCP Clinical Pharmacist  Pager: [phone] Phone: [phone] 06/07/2018 12:02 PM

## 2018-06-08 ENCOUNTER — Inpatient Hospital Stay (HOSPITAL_COMMUNITY): Payer: Self-pay

## 2018-06-08 DIAGNOSIS — F17210 Nicotine dependence, cigarettes, uncomplicated: Secondary | ICD-10-CM

## 2018-06-08 DIAGNOSIS — J90 Pleural effusion, not elsewhere classified: Secondary | ICD-10-CM

## 2018-06-08 DIAGNOSIS — F1990 Other psychoactive substance use, unspecified, uncomplicated: Secondary | ICD-10-CM

## 2018-06-08 DIAGNOSIS — B961 Klebsiella pneumoniae [K. pneumoniae] as the cause of diseases classified elsewhere: Secondary | ICD-10-CM

## 2018-06-08 DIAGNOSIS — L089 Local infection of the skin and subcutaneous tissue, unspecified: Secondary | ICD-10-CM

## 2018-06-08 DIAGNOSIS — B9562 Methicillin resistant Staphylococcus aureus infection as the cause of diseases classified elsewhere: Secondary | ICD-10-CM

## 2018-06-08 DIAGNOSIS — J15212 Pneumonia due to Methicillin resistant Staphylococcus aureus: Principal | ICD-10-CM

## 2018-06-08 DIAGNOSIS — I808 Phlebitis and thrombophlebitis of other sites: Secondary | ICD-10-CM

## 2018-06-08 DIAGNOSIS — J181 Lobar pneumonia, unspecified organism: Secondary | ICD-10-CM

## 2018-06-08 DIAGNOSIS — F1120 Opioid dependence, uncomplicated: Secondary | ICD-10-CM

## 2018-06-08 DIAGNOSIS — J869 Pyothorax without fistula: Secondary | ICD-10-CM

## 2018-06-08 LAB — ACID FAST SMEAR (AFB, MYCOBACTERIA)
Acid Fast Smear: NEGATIVE
Acid Fast Smear: NEGATIVE
Acid Fast Smear: NEGATIVE

## 2018-06-08 LAB — CULTURE, BLOOD (ROUTINE X 2)
Culture: NO GROWTH
Culture: NO GROWTH
Special Requests: ADEQUATE
Special Requests: ADEQUATE

## 2018-06-08 LAB — CBC
HCT: 35.5 % — ABNORMAL LOW (ref 39.0–52.0)
Hemoglobin: 11.8 g/dL — ABNORMAL LOW (ref 13.0–17.0)
MCH: 29.2 pg (ref 26.0–34.0)
MCHC: 33.2 g/dL (ref 30.0–36.0)
MCV: 87.9 fL (ref 80.0–100.0)
Platelets: 490 10*3/uL — ABNORMAL HIGH (ref 150–400)
RBC: 4.04 MIL/uL — ABNORMAL LOW (ref 4.22–5.81)
RDW: 13.4 % (ref 11.5–15.5)
WBC: 12.9 10*3/uL — ABNORMAL HIGH (ref 4.0–10.5)
nRBC: 0 % (ref 0.0–0.2)

## 2018-06-08 LAB — BASIC METABOLIC PANEL
Anion gap: 13 (ref 5–15)
BUN: 8 mg/dL (ref 6–20)
CO2: 21 mmol/L — ABNORMAL LOW (ref 22–32)
Calcium: 8.3 mg/dL — ABNORMAL LOW (ref 8.9–10.3)
Chloride: 100 mmol/L (ref 98–111)
Creatinine, Ser: 0.66 mg/dL (ref 0.61–1.24)
GFR calc Af Amer: >60 mL/min (ref >60)
GFR calc non Af Amer: >60 mL/min (ref >60)
Glucose, Bld: 106 mg/dL — ABNORMAL HIGH (ref 70–99)
Potassium: 4.2 mmol/L (ref 3.5–5.1)
Sodium: 134 mmol/L — ABNORMAL LOW (ref 135–145)

## 2018-06-08 MED ORDER — CEFAZOLIN SODIUM-DEXTROSE 2-4 GM/100ML-% IV SOLN
2.0000 g | Freq: Three times a day (TID) | INTRAVENOUS | Status: DC
  Administered 2018-06-08 – 2018-06-09 (×4): 2 g via INTRAVENOUS
  Filled 2018-06-08 (×5): qty 100

## 2018-06-08 MED ORDER — VANCOMYCIN HCL IN DEXTROSE 1-5 GM/200ML-% IV SOLN
1000.0000 mg | Freq: Three times a day (TID) | INTRAVENOUS | Status: DC
  Administered 2018-06-09 – 2018-06-12 (×10): 1000 mg via INTRAVENOUS
  Filled 2018-06-08 (×11): qty 200

## 2018-06-08 NOTE — Progress Notes (Signed)
Wasted 21 mL of morphine in the stericycle with Dawn, Charity fundraiser.

## 2018-06-08 NOTE — Progress Notes (Signed)
[facility] TEAM 1 - Stepdown/ICU TEAM  Todd Hooper  WUJ:[id number] DOB: Aug 16, 1965 DOA: 06/03/2018 PCP: Patient, No Pcp Per    Brief Narrative:  53yo M w/a hx of tobacco abuse, IV drug abuse, and cocaine abuse who presented with several days of L chest discomfort and SOB.  In the ED he was found to have a WBC of 11.4, temp of 100.3, HR 110, and RR 30. CTa chest noted a sizable L partially loculated pleural effusion with multifocal infiltrates. An IR guided thoracentesis noted fluid w/ 7551 WBC (96% neutrophils) and LDH 627. The patient underwent a VATS on 06/04/2018 with placement of two drainage catheters. Pleural fluid culture has grown out MRSA.  Subjective: C/o some ongoing pain. Denies sob, n/v, or abdom pain. Is currently alert and oriented, and not sedated.   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  S/P VATS 06/04/2018 w/ I&D of arm wound as well - pleural fluid has grown out MRSA - arm abscess has grown out MRSA + Kleb - blood cx x2 actually negative - no evidence of endocarditis on TTE - anterior chest tube removed 2/24 - now on RA - will ask ID to weigh in on length of tx suggested (prolonged IV abx use could be problematic due to IVD abuse hx) and need for possible further investigation (one can assume he was bacteremic given - midline IV placed 2/21 may need to be replaced (for presumptive bacteremia)  Polysubstance abuse He states that he plans on quitting  Tobacco abuse Continue Nicotine patch  DVT prophylaxis: SCDs Code Status: FULL CODE Family Communication: no family present at time of exam  Disposition Plan: SDU until chest tubes removed   Consultants:  TCTS  Antimicrobials:  Zosyn 2/20 > Vanc 2/20 >  Objective: Blood pressure (!) 136/92, pulse 93, temperature 98.3 F (36.8 C), temperature source Oral, resp. rate 16, height 5\' 8"  (1.727 m), weight 74.8 kg, SpO2 98 %.  Intake/Output  Summary (Last 24 hours) at 06/08/2018 1136 Last data filed at 06/08/2018 [id number] Gross per 24 hour  Intake 1620 ml  Output 5855 ml  Net -4235 ml   Filed Weights   06/03/18 0907 06/04/18 1023  Weight: 74.8 kg 74.8 kg    Examination: General: No acute respiratory distress - alert  Lungs: poor air movement L base - clear other fields  Cardiovascular: RRR w/o M  Abdomen: Nontender, nondistended, soft, bowel sounds positive, no rebound Extremities: No significant edema bilateral lower extremities  CBC: Recent Labs  Lab 06/06/18 0322 06/07/18 0305 06/08/18 0229  WBC 15.0* 12.7* 12.9*  NEUTROABS  --  8.5*  --   HGB 11.0* 11.2* 11.8*  HCT 33.7* 34.9* 35.5*  MCV 88.9 89.3 87.9  PLT 441* 481* 490*   Basic Metabolic Panel: Recent Labs  Lab 06/06/18 0322 06/07/18 0305 06/08/18 0229  NA 131* 134* 134*  K 3.9 3.8 4.2  CL 97* 100 100  CO2 23 26 21*  GLUCOSE 99 130* 106*  BUN 5* 5* 8  CREATININE 0.70 0.73 0.66  CALCIUM 8.0* 8.1* 8.3*   GFR: Estimated Creatinine Clearance: 104.5 mL/min (by C-G formula based on SCr of 0.66 mg/dL).  Liver Function Tests: Recent Labs  Lab 06/03/18 1558 06/06/18 0322  AST  --  21  ALT  --  17  ALKPHOS  --  78  BILITOT  --  0.7  PROT 6.2* 6.0*  ALBUMIN  --  1.8*    Cardiac Enzymes: Recent Labs  Lab 06/03/18 0926  TROPONINI <0.03     Recent Results (from the past 240 hour(s))  Blood culture (routine x 2)     Status: None   Collection Time: 06/03/18 11:29 AM  Result Value Ref Range Status   Specimen Description RIGHT ANTECUBITAL  Final   Special Requests   Final    BOTTLES DRAWN AEROBIC AND ANAEROBIC Blood Culture adequate volume   Culture   Final    NO GROWTH 5 DAYS Performed at [facility], [address], [place], [place] [address]    Report Status 06/08/2018 FINAL  Final  Blood culture (routine x 2)     Status: None   Collection Time: 06/03/18 11:29 AM  Result Value Ref Range Status   Specimen Description BLOOD RIGHT WRIST   Final   Special Requests   Final    BOTTLES DRAWN AEROBIC AND ANAEROBIC Blood Culture adequate volume   Culture   Final    NO GROWTH 5 DAYS Performed at [facility], [address], [place], [place] [address]    Report Status 06/08/2018 FINAL  Final  Fungus Culture With Stain     Status: None (Preliminary result)   Collection Time: 06/03/18  1:58 PM  Result Value Ref Range Status   Fungus Stain Final report  Final    Comment: (NOTE) Performed At: [facility] [address] [place], [place] [id number] Jolene Schimke MD BH:[id number]    Fungus (Mycology) Culture PENDING  Incomplete   Fungal Source PLEURAL  Final    Comment: LEFT Performed at [facility], [address] [place], [place] [address]   Acid Fast Smear (AFB)     Status: None   Collection Time: 06/03/18  1:58 PM  Result Value Ref Range Status   AFB Specimen Processing Concentration  Final   Acid Fast Smear Negative  Final    Comment: (NOTE) Performed At: [facility] [address] [place], [place] [id number] Jolene Schimke MD MH:[id number]    Source (AFB) PLEURAL  Final    Comment: LEFT Performed at [facility], [address], [place], [place] [address]   Gram stain     Status: None   Collection Time: 06/03/18  1:58 PM  Result Value Ref Range Status   Specimen Description PLEURAL LEFT  Final   Special Requests NONE  Final   Gram Stain   Final    CYTOSPIN SMEAR NO ORGANISMS SEEN WBC PRESENT,BOTH PMN AND MONONUCLEAR Performed at [facility], [address] [place], [place] [address]    Report Status 06/03/2018 FINAL  Final  Culture, body fluid-bottle     Status: Abnormal   Collection Time: 06/03/18  1:58 PM  Result Value Ref Range Status   Specimen Description   Final    PLEURAL LEFT Performed at [facility], [address], [place], [place] [address]    Special Requests   Final    BOTTLES DRAWN AEROBIC AND ANAEROBIC 10CC Performed at [facility], [address] [place],  [place] [address]    Gram Stain   Final    GRAM POSITIVE COCCI IN CLUSTERS WBC PRESENT, PREDOMINANTLY PMN BOTH ANA AND AEB BOTTLES Gram Stain Report Called to,Read Back By and Verified With: GRAHAM,A AT 0655 ON 06/04/18 BY HUFFINES,S. Performed at [facility], [address] [place], [place] [address]    Culture METHICILLIN RESISTANT STAPHYLOCOCCUS AUREUS (A)  Final   Report Status 06/06/2018 FINAL  Final  Organism ID, Bacteria METHICILLIN RESISTANT STAPHYLOCOCCUS AUREUS  Final      Susceptibility   Methicillin resistant staphylococcus aureus - MIC*    CIPROFLOXACIN >=8 RESISTANT Resistant     ERYTHROMYCIN >=8 RESISTANT Resistant     GENTAMICIN <=0.5 SENSITIVE Sensitive     OXACILLIN >=4 RESISTANT Resistant     TETRACYCLINE <=1 SENSITIVE Sensitive     VANCOMYCIN <=0.5 SENSITIVE Sensitive     TRIMETH/SULFA <=10 SENSITIVE Sensitive     CLINDAMYCIN >=8 RESISTANT Resistant     RIFAMPIN <=0.5 SENSITIVE Sensitive     Inducible Clindamycin NEGATIVE Sensitive     * METHICILLIN RESISTANT STAPHYLOCOCCUS AUREUS  Fungus Culture Result     Status: None   Collection Time: 06/03/18  1:58 PM  Result Value Ref Range Status   Result 1 Comment  Final    Comment: (NOTE) KOH/Calcofluor preparation:  no fungus observed. Performed At: [facility] [address] [place], [place] [id number] Jolene Schimke MD WY:[id number]   Aerobic Culture (superficial specimen)     Status: None   Collection Time: 06/03/18  5:23 PM  Result Value Ref Range Status   Specimen Description   Final    ABSCESS Performed at [facility], [address], [place], [place] [address]    Special Requests   Final    NONE Performed at [facility], [address] [place], [place] [address]    Gram Stain   Final    ABUNDANT WBC PRESENT, PREDOMINANTLY PMN RARE GRAM POSITIVE COCCI Performed at [facility], [address] [place], [place] [address]    Culture   Final    ABUNDANT METHICILLIN RESISTANT  STAPHYLOCOCCUS AUREUS FEW KLEBSIELLA PNEUMONIAE    Report Status 06/06/2018 FINAL  Final   Organism ID, Bacteria KLEBSIELLA PNEUMONIAE  Final   Organism ID, Bacteria METHICILLIN RESISTANT STAPHYLOCOCCUS AUREUS  Final      Susceptibility   Klebsiella pneumoniae - MIC*    AMPICILLIN >=32 RESISTANT Resistant     CEFAZOLIN <=4 SENSITIVE Sensitive     CEFEPIME <=1 SENSITIVE Sensitive     CEFTAZIDIME <=1 SENSITIVE Sensitive     CEFTRIAXONE <=1 SENSITIVE Sensitive     CIPROFLOXACIN <=0.25 SENSITIVE Sensitive     GENTAMICIN <=1 SENSITIVE Sensitive     IMIPENEM <=0.25 SENSITIVE Sensitive     TRIMETH/SULFA <=20 SENSITIVE Sensitive     AMPICILLIN/SULBACTAM 4 SENSITIVE Sensitive     PIP/TAZO <=4 SENSITIVE Sensitive     Extended ESBL NEGATIVE Sensitive     * FEW KLEBSIELLA PNEUMONIAE   Methicillin resistant staphylococcus aureus - MIC*    CIPROFLOXACIN >=8 RESISTANT Resistant     ERYTHROMYCIN >=8 RESISTANT Resistant     GENTAMICIN <=0.5 SENSITIVE Sensitive     OXACILLIN >=4 RESISTANT Resistant     TETRACYCLINE <=1 SENSITIVE Sensitive     VANCOMYCIN 1 SENSITIVE Sensitive     TRIMETH/SULFA <=10 SENSITIVE Sensitive     CLINDAMYCIN >=8 RESISTANT Resistant     RIFAMPIN <=0.5 SENSITIVE Sensitive     Inducible Clindamycin NEGATIVE Sensitive     * ABUNDANT METHICILLIN RESISTANT STAPHYLOCOCCUS AUREUS  MRSA PCR Screening     Status: None   Collection Time: 06/04/18 10:01 AM  Result Value Ref Range Status   MRSA by PCR NEGATIVE NEGATIVE Final    Comment:        The GeneXpert MRSA Assay (FDA approved for NASAL specimens only), is one component of a comprehensive MRSA colonization surveillance program. It is not intended to diagnose  MRSA infection nor to guide or monitor treatment for MRSA infections. Performed at [facility], [address], [place], [place] [address]   Aerobic Culture (superficial specimen)     Status: None   Collection Time: 06/04/18 11:51 AM  Result Value Ref  Range Status   Specimen Description WOUND  Final   Special Requests NONE  Final   Gram Stain   Final    FEW WBC PRESENT,BOTH PMN AND MONONUCLEAR NO ORGANISMS SEEN RESULT CALLED TO, READ BACK BY AND VERIFIED WITH: Wynetta Fines MD 12:35 06/04/18 ([email or web address]) Performed at [facility], [address] [place], [place] [address]    Culture   Final    FEW METHICILLIN RESISTANT STAPHYLOCOCCUS AUREUS RARE KLEBSIELLA PNEUMONIAE    Report Status 06/06/2018 FINAL  Final   Organism ID, Bacteria KLEBSIELLA PNEUMONIAE  Final   Organism ID, Bacteria METHICILLIN RESISTANT STAPHYLOCOCCUS AUREUS  Final      Susceptibility   Klebsiella pneumoniae - MIC*    AMPICILLIN >=32 RESISTANT Resistant     CEFAZOLIN <=4 SENSITIVE Sensitive     CEFEPIME <=1 SENSITIVE Sensitive     CEFTAZIDIME <=1 SENSITIVE Sensitive     CEFTRIAXONE <=1 SENSITIVE Sensitive     CIPROFLOXACIN <=0.25 SENSITIVE Sensitive     GENTAMICIN <=1 SENSITIVE Sensitive     IMIPENEM <=0.25 SENSITIVE Sensitive     TRIMETH/SULFA <=20 SENSITIVE Sensitive     AMPICILLIN/SULBACTAM 8 SENSITIVE Sensitive     PIP/TAZO <=4 SENSITIVE Sensitive     Extended ESBL NEGATIVE Sensitive     * RARE KLEBSIELLA PNEUMONIAE   Methicillin resistant staphylococcus aureus - MIC*    CIPROFLOXACIN >=8 RESISTANT Resistant     ERYTHROMYCIN >=8 RESISTANT Resistant     GENTAMICIN <=0.5 SENSITIVE Sensitive     OXACILLIN RESISTANT Resistant     TETRACYCLINE <=1 SENSITIVE Sensitive     VANCOMYCIN <=0.5 SENSITIVE Sensitive     TRIMETH/SULFA <=10 SENSITIVE Sensitive     CLINDAMYCIN >=8 RESISTANT Resistant     RIFAMPIN <=0.5 SENSITIVE Sensitive     Inducible Clindamycin NEGATIVE Sensitive     * FEW METHICILLIN RESISTANT STAPHYLOCOCCUS AUREUS  Culture, respiratory     Status: None   Collection Time: 06/04/18 12:02 PM  Result Value Ref Range Status   Specimen Description BRONCHIAL WASHINGS  Final   Special Requests NONE  Final   Gram Stain   Final    RARE WBC  PRESENT, PREDOMINANTLY PMN FEW SQUAMOUS EPITHELIAL CELLS PRESENT FEW GRAM POSITIVE RODS RARE GRAM POSITIVE COCCI RARE GRAM NEGATIVE RODS    Culture   Final    FEW Consistent with normal respiratory flora. Performed at [facility] [place] [facility], [address] [place], [place] [address]    Report Status 06/06/2018 FINAL  Final  Acid Fast Smear (AFB)     Status: None   Collection Time: 06/04/18 12:02 PM  Result Value Ref Range Status   AFB Specimen Processing Concentration  Final   Acid Fast Smear Negative  Final    Comment: (NOTE) Performed At: [facility] [address] [place], [place] [id number] Jolene Schimke MD OZ:[id number]    Source (AFB) PLEURAL  Final  Body fluid culture     Status: None   Collection Time: 06/04/18 12:31 PM  Result Value Ref Range Status   Specimen Description PLEURAL  Final   Special Requests LEFT  Final   Gram Stain   Final    FEW WBC PRESENT,BOTH PMN AND MONONUCLEAR MODERATE GRAM  POSITIVE COCCI Performed at [facility], [address], [place], [place] [address]    Culture FEW METHICILLIN RESISTANT STAPHYLOCOCCUS AUREUS  Final   Report Status 06/06/2018 FINAL  Final   Organism ID, Bacteria METHICILLIN RESISTANT STAPHYLOCOCCUS AUREUS  Final      Susceptibility   Methicillin resistant staphylococcus aureus - MIC*    CIPROFLOXACIN >=8 RESISTANT Resistant     ERYTHROMYCIN >=8 RESISTANT Resistant     GENTAMICIN <=0.5 SENSITIVE Sensitive     OXACILLIN >=4 RESISTANT Resistant     TETRACYCLINE <=1 SENSITIVE Sensitive     VANCOMYCIN <=0.5 SENSITIVE Sensitive     TRIMETH/SULFA <=10 SENSITIVE Sensitive     CLINDAMYCIN >=8 RESISTANT Resistant     RIFAMPIN <=0.5 SENSITIVE Sensitive     Inducible Clindamycin NEGATIVE Sensitive     * FEW METHICILLIN RESISTANT STAPHYLOCOCCUS AUREUS  Aerobic/Anaerobic Culture (surgical/deep wound)     Status: None (Preliminary result)   Collection Time: 06/04/18 12:31 PM  Result Value Ref Range Status   Specimen  Description PLEURAL  Final   Special Requests NONE  Final   Gram Stain   Final    ABUNDANT WBC PRESENT,BOTH PMN AND MONONUCLEAR RARE GRAM POSITIVE COCCI Performed at [facility], [address] [place], [place] [address]    Culture   Final    MODERATE METHICILLIN RESISTANT STAPHYLOCOCCUS AUREUS NO ANAEROBES ISOLATED; CULTURE IN PROGRESS FOR 5 DAYS    Report Status PENDING  Incomplete   Organism ID, Bacteria METHICILLIN RESISTANT STAPHYLOCOCCUS AUREUS  Final      Susceptibility   Methicillin resistant staphylococcus aureus - MIC*    CIPROFLOXACIN >=8 RESISTANT Resistant     ERYTHROMYCIN >=8 RESISTANT Resistant     GENTAMICIN <=0.5 SENSITIVE Sensitive     OXACILLIN RESISTANT Resistant     TETRACYCLINE <=1 SENSITIVE Sensitive     VANCOMYCIN <=0.5 SENSITIVE Sensitive     TRIMETH/SULFA <=10 SENSITIVE Sensitive     CLINDAMYCIN >=8 RESISTANT Resistant     RIFAMPIN <=0.5 SENSITIVE Sensitive     Inducible Clindamycin NEGATIVE Sensitive     * MODERATE METHICILLIN RESISTANT STAPHYLOCOCCUS AUREUS  Acid Fast Smear (AFB)     Status: None   Collection Time: 06/04/18 12:31 PM  Result Value Ref Range Status   AFB Specimen Processing Concentration  Final   Acid Fast Smear Negative  Final    Comment: (NOTE) Performed At: [facility] [address] [place], [place] [id number] Jolene Schimke MD WG:[id number]    Source (AFB) BRONCHIAL WASHINGS  Final  Acid Fast Smear (AFB)     Status: None   Collection Time: 06/04/18 12:35 PM  Result Value Ref Range Status   AFB Specimen Processing Concentration  Final   Acid Fast Smear Negative  Final    Comment: (NOTE) Performed At: [facility] [address] [place], [place] [id number] Jolene Schimke MD [id number]    Source (AFB) PLEURAL  Final     Scheduled Meds: . bisacodyl  10 mg Oral Daily  . gabapentin  300 mg Oral TID  . guaiFENesin  600 mg Oral BID  . hydrOXYzine  25 mg Oral TID  . hydrOXYzine  50 mg Oral QHS    . methocarbamol  750 mg Oral Q6H  . morphine   Intravenous Q4H  . multivitamin with minerals  1 tablet Oral Daily  . nicotine  21 mg Transdermal Daily  . polyethylene glycol  17 g Oral Daily  . senna  2 tablet Oral  QHS  . senna-docusate  1 tablet Oral QHS  . sodium chloride flush  10-40 mL Intracatheter Q12H   Continuous Infusions: . sodium chloride 50 mL/hr at 06/07/18 0952  .  ceFAZolin (ANCEF) IV    . potassium chloride    . vancomycin 1,000 mg (06/08/18 1031)     LOS: 5 days   Lonia Blood, MD [facility] Office  [phone] Pager - Text Page per Amion  If 7PM-7AM, please contact night-coverage per Amion 06/08/2018, 11:36 AM

## 2018-06-08 NOTE — Progress Notes (Signed)
Per orders patient posterior chest tube removed. Patient tolerated well.  DG 2 view ordered.  RN will continue to monitor.

## 2018-06-08 NOTE — Progress Notes (Addendum)
[address]       [place] [address]             [phone]      4 Days Post-Op Procedure(s) (LRB): VIDEO BRONCHOSCOPY (N/A) Packing of Left Upper Arm Wound (Left) Left Video Assisted Thoracoscopy (Vats)/Decortication and Drainage of Empyema (Left) Subjective: conts to slowly feel better  Objective: Vital signs in last 24 hours: Temp:  [98.3 F (36.8 C)-100.8 F (38.2 C)] 98.3 F (36.8 C) (02/25 0712) Pulse Rate:  [81-96] 93 (02/25 0459) Cardiac Rhythm: Normal sinus rhythm (02/25 0712) Resp:  [16-23] 16 (02/25 0502) BP: (99-153)/(56-97) 136/92 (02/25 0712) SpO2:  [90 %-96 %] 95 % (02/25 0459)  Hemodynamic parameters for last 24 hours:    Intake/Output from previous day: 02/24 0701 - 02/25 0700 In: 1720 [P.O.:720; I.V.:650; IV Piggyback:350] Out: 4255 [Urine:4225; Chest Tube:30] Intake/Output this shift: Total I/O In: -  Out: 2260 [Urine:2250; Chest Tube:10]  General appearance: alert, cooperative and no distress Heart: regular rate and rhythm Lungs: dim in bases Abdomen: less distended, soft, non-tender Extremities: no edema or calf tenderness Wound: incis healing well, l arm packed  with some purulent drainage, no cellulitis  Lab Results: Recent Labs    06/07/18 0305 06/08/18 0229  WBC 12.7* 12.9*  HGB 11.2* 11.8*  HCT 34.9* 35.5*  PLT 481* 490*   BMET:  Recent Labs    06/07/18 0305 06/08/18 0229  NA 134* 134*  K 3.8 4.2  CL 100 100  CO2 26 21*  GLUCOSE 130* 106*  BUN 5* 8  CREATININE 0.73 0.66  CALCIUM 8.1* 8.3*    PT/INR: No results for input(s): LABPROT, INR in the last 72 hours. ABG    Component Value Date/Time   PHART 7.467 (H) 06/05/2018 0450   HCO3 26.6 06/05/2018 0450   O2SAT 93.6 06/05/2018 0450   CBG (last 3)  No results for input(s): GLUCAP in the last 72 hours.  Meds Scheduled Meds: . bisacodyl  10 mg Oral Daily  . gabapentin  300 mg Oral TID  . guaiFENesin  600 mg Oral BID  . hydrOXYzine  25  mg Oral TID  . hydrOXYzine  50 mg Oral QHS  . methocarbamol  750 mg Oral Q6H  . morphine   Intravenous Q4H  . multivitamin with minerals  1 tablet Oral Daily  . nicotine  21 mg Transdermal Daily  . polyethylene glycol  17 g Oral Daily  . senna  2 tablet Oral QHS  . senna-docusate  1 tablet Oral QHS  . sodium chloride flush  10-40 mL Intracatheter Q12H   Continuous Infusions: . sodium chloride 50 mL/hr at 06/07/18 0952  . piperacillin-tazobactam (ZOSYN)  IV 3.375 g (06/08/18 0501)  . potassium chloride    . vancomycin 1,000 mg (06/07/18 2303)   PRN Meds:.acetaminophen, diphenhydrAMINE **OR** diphenhydrAMINE, guaiFENesin-codeine, ipratropium-albuterol, naloxone **AND** sodium chloride flush, ondansetron **OR** [DISCONTINUED] ondansetron (ZOFRAN) IV, oxyCODONE, potassium chloride, traMADol, traZODone  Xrays Dg Chest Port 1 View  Result Date: 06/07/2018 CLINICAL DATA:  Chest tube in place. EXAM: PORTABLE CHEST 1 VIEW COMPARISON:  Radiographs of June 06, 2018. FINDINGS: Stable cardiomediastinal silhouette. Left-sided chest tube is unchanged in position. Mild left basilar atelectasis or infiltrate is noted with small associated pleural effusion or pleural thickening. Stable minimal right basilar subsegmental atelectasis. No pneumothorax is noted. Bony thorax is unremarkable. IMPRESSION: Stable left basilar atelectasis or infiltrate is noted, with associated small pleural effusion or pleural thickening. Left-sided chest  tube is unchanged in position without pneumothorax. Stable minimal right basilar subsegmental atelectasis. Electronically Signed   By: Lupita Raider, M.D.   On: 06/07/2018 13:59   Dg Abd Acute 2+v W 1v Chest  Result Date: 06/06/2018 CLINICAL DATA:  Status post bronchoscopy. EXAM: DG ABDOMEN ACUTE W/ 1V CHEST COMPARISON:  06/05/2018 FINDINGS: Diffuse gaseous distension small and large bowel in a nonspecific manner. No evidence of suspicious calcifications, or organomegaly.  Enlarged cardiac silhouette. Stable appearance of right IJ venous sheath. Streaky airspace opacities in the left lower hemithorax with probable small left pleural effusion. IMPRESSION: 1. Diffuse gaseous distension of small and large bowel in a nonspecific pattern. 2. Streaky airspace opacities in the left lower hemithorax with probable small left pleural effusion. 3. Enlarged cardiac silhouette. Electronically Signed   By: Ted Mcalpine M.D.   On: 06/06/2018 14:00   Recent Results (from the past 240 hour(s))  Blood culture (routine x 2)     Status: None   Collection Time: 06/03/18 11:29 AM  Result Value Ref Range Status   Specimen Description RIGHT ANTECUBITAL  Final   Special Requests   Final    BOTTLES DRAWN AEROBIC AND ANAEROBIC Blood Culture adequate volume   Culture   Final    NO GROWTH 5 DAYS Performed at [facility], [address] [place], [place] [address]    Report Status 06/08/2018 FINAL  Final  Blood culture (routine x 2)     Status: None   Collection Time: 06/03/18 11:29 AM  Result Value Ref Range Status   Specimen Description BLOOD RIGHT WRIST  Final   Special Requests   Final    BOTTLES DRAWN AEROBIC AND ANAEROBIC Blood Culture adequate volume   Culture   Final    NO GROWTH 5 DAYS Performed at [facility], [address] [place], [place] [address]    Report Status 06/08/2018 FINAL  Final  Fungus Culture With Stain     Status: None (Preliminary result)   Collection Time: 06/03/18  1:58 PM  Result Value Ref Range Status   Fungus Stain Final report  Final    Comment: (NOTE) Performed At: [facility] [address] [place], [place] [id number] Jolene Schimke MD WG:[id number]    Fungus (Mycology) Culture PENDING  Incomplete   Fungal Source PLEURAL  Final    Comment: LEFT Performed at [facility], [address], [place], [place] [address]   Acid Fast Smear (AFB)     Status: None   Collection Time: 06/03/18  1:58 PM  Result Value Ref Range Status    AFB Specimen Processing Concentration  Final   Acid Fast Smear Negative  Final    Comment: (NOTE) Performed At: [facility] [address] [place], [place] [id number] Jolene Schimke MD KG:[id number]    Source (AFB) PLEURAL  Final    Comment: LEFT Performed at [facility], [address], [place], [place] [address]   Gram stain     Status: None   Collection Time: 06/03/18  1:58 PM  Result Value Ref Range Status   Specimen Description PLEURAL LEFT  Final   Special Requests NONE  Final   Gram Stain   Final    CYTOSPIN SMEAR NO ORGANISMS SEEN WBC PRESENT,BOTH PMN AND MONONUCLEAR Performed at [facility], [address], [place], [place] [address]    Report Status 06/03/2018 FINAL  Final  Culture, body fluid-bottle     Status: Abnormal   Collection Time: 06/03/18  1:58 PM  Result Value Ref Range Status   Specimen Description   Final    PLEURAL LEFT Performed at [facility], [address] [place], [place] [address]    Special Requests   Final    BOTTLES DRAWN AEROBIC AND ANAEROBIC 10CC Performed at [facility], [address] [place], [place] [address]    Gram Stain   Final    GRAM POSITIVE COCCI IN CLUSTERS WBC PRESENT, PREDOMINANTLY PMN BOTH ANA AND AEB BOTTLES Gram Stain Report Called to,Read Back By and Verified With: GRAHAM,A AT 0655 ON 06/04/18 BY HUFFINES,S. Performed at [facility], [address] [place], [place] [address]    Culture METHICILLIN RESISTANT STAPHYLOCOCCUS AUREUS (A)  Final   Report Status 06/06/2018 FINAL  Final   Organism ID, Bacteria METHICILLIN RESISTANT STAPHYLOCOCCUS AUREUS  Final      Susceptibility   Methicillin resistant staphylococcus aureus - MIC*    CIPROFLOXACIN >=8 RESISTANT Resistant     ERYTHROMYCIN >=8 RESISTANT Resistant     GENTAMICIN <=0.5 SENSITIVE Sensitive     OXACILLIN >=4 RESISTANT Resistant     TETRACYCLINE <=1 SENSITIVE Sensitive     VANCOMYCIN <=0.5 SENSITIVE Sensitive     TRIMETH/SULFA <=10 SENSITIVE  Sensitive     CLINDAMYCIN >=8 RESISTANT Resistant     RIFAMPIN <=0.5 SENSITIVE Sensitive     Inducible Clindamycin NEGATIVE Sensitive     * METHICILLIN RESISTANT STAPHYLOCOCCUS AUREUS  Fungus Culture Result     Status: None   Collection Time: 06/03/18  1:58 PM  Result Value Ref Range Status   Result 1 Comment  Final    Comment: (NOTE) KOH/Calcofluor preparation:  no fungus observed. Performed At: [facility] [address] [place], [place] [id number] Jolene SchimkeNagendra Sanjai MD OZ:[id number]:[phone]   Aerobic Culture (superficial specimen)     Status: None   Collection Time: 06/03/18  5:23 PM  Result Value Ref Range Status   Specimen Description   Final    ABSCESS Performed at [facility], [address] [place], [place] [address]    Special Requests   Final    NONE Performed at [facility], [address] [place], [place] [address]    Gram Stain   Final    ABUNDANT WBC PRESENT, PREDOMINANTLY PMN RARE GRAM POSITIVE COCCI Performed at [facility], [address] [place], [place] [address]    Culture   Final    ABUNDANT METHICILLIN RESISTANT STAPHYLOCOCCUS AUREUS FEW KLEBSIELLA PNEUMONIAE    Report Status 06/06/2018 FINAL  Final   Organism ID, Bacteria KLEBSIELLA PNEUMONIAE  Final   Organism ID, Bacteria METHICILLIN RESISTANT STAPHYLOCOCCUS AUREUS  Final      Susceptibility   Klebsiella pneumoniae - MIC*    AMPICILLIN >=32 RESISTANT Resistant     CEFAZOLIN <=4 SENSITIVE Sensitive     CEFEPIME <=1 SENSITIVE Sensitive     CEFTAZIDIME <=1 SENSITIVE Sensitive     CEFTRIAXONE <=1 SENSITIVE Sensitive     CIPROFLOXACIN <=0.25 SENSITIVE Sensitive     GENTAMICIN <=1 SENSITIVE Sensitive     IMIPENEM <=0.25 SENSITIVE Sensitive     TRIMETH/SULFA <=20 SENSITIVE Sensitive     AMPICILLIN/SULBACTAM 4 SENSITIVE Sensitive     PIP/TAZO <=4 SENSITIVE Sensitive     Extended ESBL NEGATIVE Sensitive     * FEW KLEBSIELLA PNEUMONIAE   Methicillin resistant staphylococcus aureus - MIC*     CIPROFLOXACIN >=8 RESISTANT Resistant     ERYTHROMYCIN >=8 RESISTANT Resistant     GENTAMICIN <=0.5 SENSITIVE Sensitive  OXACILLIN >=4 RESISTANT Resistant     TETRACYCLINE <=1 SENSITIVE Sensitive     VANCOMYCIN 1 SENSITIVE Sensitive     TRIMETH/SULFA <=10 SENSITIVE Sensitive     CLINDAMYCIN >=8 RESISTANT Resistant     RIFAMPIN <=0.5 SENSITIVE Sensitive     Inducible Clindamycin NEGATIVE Sensitive     * ABUNDANT METHICILLIN RESISTANT STAPHYLOCOCCUS AUREUS  MRSA PCR Screening     Status: None   Collection Time: 06/04/18 10:01 AM  Result Value Ref Range Status   MRSA by PCR NEGATIVE NEGATIVE Final    Comment:        The GeneXpert MRSA Assay (FDA approved for NASAL specimens only), is one component of a comprehensive MRSA colonization surveillance program. It is not intended to diagnose MRSA infection nor to guide or monitor treatment for MRSA infections. Performed at [facility], [address], [place], [place] [address]   Aerobic Culture (superficial specimen)     Status: None   Collection Time: 06/04/18 11:51 AM  Result Value Ref Range Status   Specimen Description WOUND  Final   Special Requests NONE  Final   Gram Stain   Final    FEW WBC PRESENT,BOTH PMN AND MONONUCLEAR NO ORGANISMS SEEN RESULT CALLED TO, READ BACK BY AND VERIFIED WITH: Wynetta Fines MD 12:35 06/04/18 ([email or web address]) Performed at [facility], [address] [place], [place] [address]    Culture   Final    FEW METHICILLIN RESISTANT STAPHYLOCOCCUS AUREUS RARE KLEBSIELLA PNEUMONIAE    Report Status 06/06/2018 FINAL  Final   Organism ID, Bacteria KLEBSIELLA PNEUMONIAE  Final   Organism ID, Bacteria METHICILLIN RESISTANT STAPHYLOCOCCUS AUREUS  Final      Susceptibility   Klebsiella pneumoniae - MIC*    AMPICILLIN >=32 RESISTANT Resistant     CEFAZOLIN <=4 SENSITIVE Sensitive     CEFEPIME <=1 SENSITIVE Sensitive     CEFTAZIDIME <=1 SENSITIVE Sensitive     CEFTRIAXONE <=1 SENSITIVE Sensitive       CIPROFLOXACIN <=0.25 SENSITIVE Sensitive     GENTAMICIN <=1 SENSITIVE Sensitive     IMIPENEM <=0.25 SENSITIVE Sensitive     TRIMETH/SULFA <=20 SENSITIVE Sensitive     AMPICILLIN/SULBACTAM 8 SENSITIVE Sensitive     PIP/TAZO <=4 SENSITIVE Sensitive     Extended ESBL NEGATIVE Sensitive     * RARE KLEBSIELLA PNEUMONIAE   Methicillin resistant staphylococcus aureus - MIC*    CIPROFLOXACIN >=8 RESISTANT Resistant     ERYTHROMYCIN >=8 RESISTANT Resistant     GENTAMICIN <=0.5 SENSITIVE Sensitive     OXACILLIN RESISTANT Resistant     TETRACYCLINE <=1 SENSITIVE Sensitive     VANCOMYCIN <=0.5 SENSITIVE Sensitive     TRIMETH/SULFA <=10 SENSITIVE Sensitive     CLINDAMYCIN >=8 RESISTANT Resistant     RIFAMPIN <=0.5 SENSITIVE Sensitive     Inducible Clindamycin NEGATIVE Sensitive     * FEW METHICILLIN RESISTANT STAPHYLOCOCCUS AUREUS  Culture, respiratory     Status: None   Collection Time: 06/04/18 12:02 PM  Result Value Ref Range Status   Specimen Description BRONCHIAL WASHINGS  Final   Special Requests NONE  Final   Gram Stain   Final    RARE WBC PRESENT, PREDOMINANTLY PMN FEW SQUAMOUS EPITHELIAL CELLS PRESENT FEW GRAM POSITIVE RODS RARE GRAM POSITIVE COCCI RARE GRAM NEGATIVE RODS    Culture   Final    FEW Consistent with normal respiratory flora. Performed at [facility], [address], [place], [place] [address]  Report Status 06/06/2018 FINAL  Final  Body fluid culture     Status: None   Collection Time: 06/04/18 12:31 PM  Result Value Ref Range Status   Specimen Description PLEURAL  Final   Special Requests LEFT  Final   Gram Stain   Final    FEW WBC PRESENT,BOTH PMN AND MONONUCLEAR MODERATE GRAM POSITIVE COCCI Performed at [facility], [address], [place], [place] [address]    Culture FEW METHICILLIN RESISTANT STAPHYLOCOCCUS AUREUS  Final   Report Status 06/06/2018 FINAL  Final   Organism ID, Bacteria METHICILLIN RESISTANT STAPHYLOCOCCUS AUREUS  Final       Susceptibility   Methicillin resistant staphylococcus aureus - MIC*    CIPROFLOXACIN >=8 RESISTANT Resistant     ERYTHROMYCIN >=8 RESISTANT Resistant     GENTAMICIN <=0.5 SENSITIVE Sensitive     OXACILLIN >=4 RESISTANT Resistant     TETRACYCLINE <=1 SENSITIVE Sensitive     VANCOMYCIN <=0.5 SENSITIVE Sensitive     TRIMETH/SULFA <=10 SENSITIVE Sensitive     CLINDAMYCIN >=8 RESISTANT Resistant     RIFAMPIN <=0.5 SENSITIVE Sensitive     Inducible Clindamycin NEGATIVE Sensitive     * FEW METHICILLIN RESISTANT STAPHYLOCOCCUS AUREUS  Aerobic/Anaerobic Culture (surgical/deep wound)     Status: None (Preliminary result)   Collection Time: 06/04/18 12:31 PM  Result Value Ref Range Status   Specimen Description PLEURAL  Final   Special Requests NONE  Final   Gram Stain   Final    ABUNDANT WBC PRESENT,BOTH PMN AND MONONUCLEAR RARE GRAM POSITIVE COCCI Performed at [facility], [address] [place], [place] [address]    Culture   Final    MODERATE METHICILLIN RESISTANT STAPHYLOCOCCUS AUREUS NO ANAEROBES ISOLATED; CULTURE IN PROGRESS FOR 5 DAYS    Report Status PENDING  Incomplete   Organism ID, Bacteria METHICILLIN RESISTANT STAPHYLOCOCCUS AUREUS  Final      Susceptibility   Methicillin resistant staphylococcus aureus - MIC*    CIPROFLOXACIN >=8 RESISTANT Resistant     ERYTHROMYCIN >=8 RESISTANT Resistant     GENTAMICIN <=0.5 SENSITIVE Sensitive     OXACILLIN RESISTANT Resistant     TETRACYCLINE <=1 SENSITIVE Sensitive     VANCOMYCIN <=0.5 SENSITIVE Sensitive     TRIMETH/SULFA <=10 SENSITIVE Sensitive     CLINDAMYCIN >=8 RESISTANT Resistant     RIFAMPIN <=0.5 SENSITIVE Sensitive     Inducible Clindamycin NEGATIVE Sensitive     * MODERATE METHICILLIN RESISTANT STAPHYLOCOCCUS AUREUS   Assessment/Plan: S/P Procedure(s) (LRB): VIDEO BRONCHOSCOPY (N/A) Packing of Left Upper Arm Wound (Left) Left Video Assisted Thoracoscopy (Vats)/Decortication and Drainage of Empyema  (Left)  1 stable with steady improvement. BP is somewhat variable- cont to monitor 2 minimal CT drainage- d/c second tube soon 3 conts IV abx and left arm dressing changes, + MRSA. Leukocytosis is stable 4 anemia is stable 5 BS controlled 6 CXR is showing improved air space disease 7 renal fxn is good 8 primary med management per medicine service  LOS: 5 days    Todd Hooper [facility] 06/08/2018 Pager [phone] minimal  drainage from chest tube, will remove  I have seen and examined Todd Hooper and agree with the above assessment  and plan.  Delight Ovens MD Beeper [phone] Office [phone] 06/08/2018 10:10 AM

## 2018-06-08 NOTE — Plan of Care (Signed)
  Problem: Activity: Goal: Ability to tolerate increased activity will improve Outcome: Progressing   Problem: Clinical Measurements: Goal: Ability to maintain a body temperature in the normal range will improve Outcome: Progressing   Problem: Respiratory: Goal: Ability to maintain adequate ventilation will improve Outcome: Progressing Goal: Ability to maintain a clear airway will improve Outcome: Progressing   Problem: Education: Goal: Knowledge of General Education information will improve Description: Including pain rating scale, medication(s)/side effects and non-pharmacologic comfort measures Outcome: Progressing   

## 2018-06-08 NOTE — Progress Notes (Signed)
Morphine PCA syringe empty replaced with new syringe. O mL's wasted. Verfied with Tammy, RN

## 2018-06-08 NOTE — Consult Note (Signed)
[facility]  Total days of antibiotics 6               Reason for Consult:MRSA empyema and SSTI    Referring Physician: [email or web address]  Principal Problem:   PNA (pneumonia)/Lt Sided Efusion/Empyema Active Problems:   Polysubstance abuse -- IVDU (Cocaine)   Tobacco abuse   Empyema of left pleural space (HCC)    HPI: Todd Hooper is a 53 y.o. male who is a PWID was admitted on 2/21 for worsening left arm pain/swelling at [facility] x 2 wk in addition to increasing shortness of breath with pleurisy in addition to chills/nightsweats. He was found to have a lave a loculated pleural effusion c/w with MRSA pneumonia as well as his left AC had evidence of purulent drainage and erythema/phlebitis at venipuncture site where he previously injected. He underwent left sided VATS -decortication plus chest tube placement for management of empyema on 2/21. His pleural fluid cx showing MRSA. Wound cx from left arm showing MRSA and klebsiella pneumoniae. Interestingly his blood cx NGTD. He was narrowed to vancomycin plus cefazolin. Chest tube removed on 2/25. Still having some pleurisy with deep inspiration. Arms swelling and erythema is improved. Denies hx of SSTI or endocarditis in the past. He remains intermittently febrile. Underwent TTE which showed no evidence of endocarditis  History reviewed. No pertinent past medical history.  Allergies: No Known Allergies  Current antibiotics:   MEDICATIONS: . bisacodyl  10 mg Oral Daily  . gabapentin  300 mg Oral TID  . guaiFENesin  600 mg Oral BID  . hydrOXYzine  25 mg Oral TID  . hydrOXYzine  50 mg Oral QHS  . methocarbamol  750 mg Oral Q6H  . multivitamin with minerals  1 tablet Oral Daily  . nicotine  21 mg Transdermal Daily  . polyethylene glycol  17 g Oral Daily  . senna  2 tablet Oral QHS  . senna-docusate  1 tablet Oral QHS  . sodium chloride flush  10-40 mL Intracatheter Q12H    Social History   Tobacco Use  . Smoking status:  Current Every Day Smoker    Types: Cigarettes  . Smokeless tobacco: Never Used  Substance Use Topics  . Alcohol use: Yes    Comment: Occasionally   . Drug use: Not Currently    Types: Cocaine, Marijuana    History reviewed. No pertinent family history.  Review of Systems - Review of Systems  Constitutional: Negative for fever, chills, diaphoresis, activity change, appetite change, fatigue and unexpected weight change.  HENT: Negative for congestion, sore throat, rhinorrhea, sneezing, trouble swallowing and sinus pressure.  Eyes: Negative for photophobia and visual disturbance.  Respiratory:+ DOE, pleurisy. Negative for cough, chest tightness, wheezing and stridor.  Cardiovascular: Negative for chest pain, palpitations and leg swelling.  Gastrointestinal: Negative for nausea, vomiting, abdominal pain, diarrhea, constipation, blood in stool, abdominal distention and anal bleeding.  Genitourinary: Negative for dysuria, hematuria, flank pain and difficulty urinating.  Musculoskeletal: Negative for myalgias, back pain, joint swelling, arthralgias and gait problem.  Skin: +left arm redness.Negative for color change, pallor, rash and wound.  Neurological: Negative for dizziness, tremors, weakness and light-headedness.  Hematological: Negative for adenopathy. Does not bruise/bleed easily.  Psychiatric/Behavioral: Negative for behavioral problems, confusion, sleep disturbance, dysphoric mood, decreased concentration and agitation.      OBJECTIVE: Temp:  [98.3 F (36.8 C)-100.8 F (38.2 C)] 99.4 F (37.4 C) (02/25 2013) Pulse Rate:  [88-93] 88 (02/25 2013) Resp:  [16-21] 17 (  02/25 2013) BP: (123-141)/(74-97) 123/74 (02/25 2013) SpO2:  [93 %-98 %] 95 % (02/25 2013) Physical Exam  Constitutional: He is oriented to person, place, and time. He appears well-developed and well-nourished. No distress.  HENT:  Mouth/Throat: Oropharynx is clear and moist. No oropharyngeal exudate.    Cardiovascular: Normal rate, regular rhythm and normal heart sounds. Exam reveals no gallop and no friction rub.  No murmur heard.  Pulmonary/Chest: Effort normal andNo respiratory distress. He has no wheezes. Decrease breath sounds on left base Abdominal: Soft. Bowel sounds are normal. He exhibits no distension. There is no tenderness.  Lymphadenopathy:  He has no cervical adenopathy. Ext: left ac lessened erythema and midlly swollen Neurological: He is alert and oriented to person, place, and time.  Skin: Skin is warm and dry. No rash noted. No erythema.  Psychiatric: He has a normal mood and affect. His behavior is normal.     LABS: Results for orders placed or performed during the hospital encounter of 06/03/18 (from the past 48 hour(s))  Vancomycin, trough     Status: Abnormal   Collection Time: 06/07/18  3:05 AM  Result Value Ref Range   Vancomycin Tr 6 (L) 15 - 20 ug/mL    Comment: Performed at [facility], [address] [place], [place] [address]  CBC with Differential/Platelet     Status: Abnormal   Collection Time: 06/07/18  3:05 AM  Result Value Ref Range   WBC 12.7 (H) 4.0 - 10.5 K/uL   RBC 3.91 (L) 4.22 - 5.81 MIL/uL   Hemoglobin 11.2 (L) 13.0 - 17.0 g/dL   HCT 58.8 (L) 32.5 - 49.8 %   MCV 89.3 80.0 - 100.0 fL   MCH 28.6 26.0 - 34.0 pg   MCHC 32.1 30.0 - 36.0 g/dL   RDW 26.4 15.8 - 30.9 %   Platelets 481 (H) 150 - 400 K/uL   nRBC 0.0 0.0 - 0.2 %   Neutrophils Relative % 67 %   Neutro Abs 8.5 (H) 1.7 - 7.7 K/uL   Lymphocytes Relative 17 %   Lymphs Abs 2.2 0.7 - 4.0 K/uL   Monocytes Relative 10 %   Monocytes Absolute 1.3 (H) 0.1 - 1.0 K/uL   Eosinophils Relative 4 %   Eosinophils Absolute 0.5 0.0 - 0.5 K/uL   Basophils Relative 0 %   Basophils Absolute 0.1 0.0 - 0.1 K/uL   Immature Granulocytes 2 %   Abs Immature Granulocytes 0.23 (H) 0.00 - 0.07 K/uL    Comment: Performed at [facility], [address], [place], [place] [address]  Basic  metabolic panel     Status: Abnormal   Collection Time: 06/07/18  3:05 AM  Result Value Ref Range   Sodium 134 (L) 135 - 145 mmol/L   Potassium 3.8 3.5 - 5.1 mmol/L   Chloride 100 98 - 111 mmol/L   CO2 26 22 - 32 mmol/L   Glucose, Bld 130 (H) 70 - 99 mg/dL   BUN 5 (L) 6 - 20 mg/dL   Creatinine, Ser 0.88 0.61 - 1.24 mg/dL   Calcium 8.1 (L) 8.9 - 10.3 mg/dL   GFR calc non Af Amer >60 >60 mL/min   GFR calc Af Amer >60 >60 mL/min   Anion gap 8 5 - 15    Comment: Performed at [facility], [address] [place], [place] [address]  Basic metabolic panel     Status: Abnormal   Collection Time: 06/08/18  2:29  AM  Result Value Ref Range   Sodium 134 (L) 135 - 145 mmol/L   Potassium 4.2 3.5 - 5.1 mmol/L   Chloride 100 98 - 111 mmol/L   CO2 21 (L) 22 - 32 mmol/L   Glucose, Bld 106 (H) 70 - 99 mg/dL   BUN 8 6 - 20 mg/dL   Creatinine, Ser 0.98 0.61 - 1.24 mg/dL   Calcium 8.3 (L) 8.9 - 10.3 mg/dL   GFR calc non Af Amer >60 >60 mL/min   GFR calc Af Amer >60 >60 mL/min   Anion gap 13 5 - 15    Comment: Performed at [facility], [address], [place], [place] [address]  CBC     Status: Abnormal   Collection Time: 06/08/18  2:29 AM  Result Value Ref Range   WBC 12.9 (H) 4.0 - 10.5 K/uL   RBC 4.04 (L) 4.22 - 5.81 MIL/uL   Hemoglobin 11.8 (L) 13.0 - 17.0 g/dL   HCT 78.2 (L) 95.6 - 21.3 %   MCV 87.9 80.0 - 100.0 fL   MCH 29.2 26.0 - 34.0 pg   MCHC 33.2 30.0 - 36.0 g/dL   RDW 08.6 57.8 - 46.9 %   Platelets 490 (H) 150 - 400 K/uL   nRBC 0.0 0.0 - 0.2 %    Comment: Performed at [facility], [address], [place], [place] [address]    MICRO: Reviewed  Methicillin resistant staphylococcus aureus    MIC    CIPROFLOXACIN >=8 RESISTANT  Resistant    CLINDAMYCIN >=8 RESISTANT  Resistant    ERYTHROMYCIN >=8 RESISTANT  Resistant    GENTAMICIN <=0.5 SENSI... Sensitive    Inducible Clindamycin NEGATIVE  Sensitive    OXACILLIN >=4 RESISTANT  Resistant    RIFAMPIN <=0.5  SENSI... Sensitive    TETRACYCLINE <=1 SENSITIVE  Sensitive    TRIMETH/SULFA <=10 SENSIT... Sensitive    VANCOMYCIN <=0.5 SENSI... Sensitive         Susceptibility Comments    IMAGING: Dg Chest 2 View  Result Date: 06/08/2018 CLINICAL DATA:  Left chest tube removal EXAM: CHEST - 2 VIEW COMPARISON:  06/08/2018 FINDINGS: Interval removal of left chest tube. No visible pneumothorax. Left pleural effusion or pleural thickening laterally again noted, stable. Left lower lobe opacity is stable. Linear atelectasis or scarring at the right base. Heart is normal size. IMPRESSION: Interval removal of left chest tube without visible pneumothorax. Otherwise no change. Electronically Signed   By: Charlett Nose M.D.   On: 06/08/2018 17:33   Dg Chest Port 1 View  Result Date: 06/08/2018 CLINICAL DATA:  Chest tube placement EXAM: PORTABLE CHEST 1 VIEW COMPARISON:  06/07/2018 FINDINGS: Left chest tube is in place. A tiny residual left apical pneumothorax measures approximately 5 mm. Heart size is enlarged. Extensive airspace disease within the left lower lung is unchanged from previous exam. Platelike atelectasis within the right base is unchanged. IMPRESSION: 1. Left chest tube in place with tiny left apical pneumothorax. 2. No change in aeration to the left lower lung. Electronically Signed   By: Signa Kell M.D.   On: 06/08/2018 09:23   Dg Chest Port 1 View  Result Date: 06/07/2018 CLINICAL DATA:  Chest tube in place. EXAM: PORTABLE CHEST 1 VIEW COMPARISON:  Radiographs of June 06, 2018. FINDINGS: Stable cardiomediastinal silhouette. Left-sided chest tube is unchanged in position. Mild left basilar atelectasis or infiltrate is noted with small associated pleural effusion or pleural thickening. Stable minimal right basilar  subsegmental atelectasis. No pneumothorax is noted. Bony thorax is unremarkable. IMPRESSION: Stable left basilar atelectasis or infiltrate is noted, with associated small pleural effusion  or pleural thickening. Left-sided chest tube is unchanged in position without pneumothorax. Stable minimal right basilar subsegmental atelectasis. Electronically Signed   By: Lupita Raider, M.D.   On: 06/07/2018 13:59   Assessment/Plan:  53yo M with PWID, admitted for fevers, pleurisy and 2 wk history of left ac soft tissue infection found to have MRSA pneumonia/empyema s/p decortication and polymicrobial (k, pneumo + MRSA) SSTI. Still with intermittent fevers.  - recommend repeat blood cx to see if any evidence of bacteremia - recommend to get TEE to ensure that he does not have evidence of SBE that we would lengthen his course of treatment  MRSA empyema = would recommend to have IV therapy for 10-14d then can switch to oral abtx  PWID = recommend to check for hep C.   Opiate dependence = once improved from acute pain; possibly would benefit from suboxone

## 2018-06-08 NOTE — Progress Notes (Signed)
Pt IV continuous alarms.  Checked IV. RN attempted to flush. IV would not flush. Cap on midline wiped with CHG, changed and second attempt to flush without success. IV consult placed to help troubleshoot. Awaiting IV team consult arrival.

## 2018-06-08 NOTE — Progress Notes (Addendum)
Telesitter assigned to patient. Patient educated verbally about telesitter. RN advise patient sitter was needed for safety reasons due to high doses of PCA, multiple chest tubes, and ongoing antibiotic therapy. Patient verbalized understanding. RN will continue to monitor.

## 2018-06-09 ENCOUNTER — Inpatient Hospital Stay (HOSPITAL_COMMUNITY): Payer: Self-pay

## 2018-06-09 LAB — AEROBIC/ANAEROBIC CULTURE W GRAM STAIN (SURGICAL/DEEP WOUND)

## 2018-06-09 NOTE — Plan of Care (Signed)
  Problem: Activity: Goal: Ability to tolerate increased activity will improve 06/09/2018 2050 by Reynold Bowen, RN Outcome: Progressing 06/09/2018 2050 by Reynold Bowen, RN Outcome: Progressing   Problem: Clinical Measurements: Goal: Ability to maintain a body temperature in the normal range will improve 06/09/2018 2050 by Reynold Bowen, RN Outcome: Progressing 06/09/2018 2050 by Reynold Bowen, RN Outcome: Progressing   Problem: Respiratory: Goal: Ability to maintain adequate ventilation will improve 06/09/2018 2050 by Reynold Bowen, RN Outcome: Progressing 06/09/2018 2050 by Reynold Bowen, RN Outcome: Progressing Goal: Ability to maintain a clear airway will improve 06/09/2018 2050 by Reynold Bowen, RN Outcome: Progressing 06/09/2018 2050 by Reynold Bowen, RN Outcome: Progressing   Problem: Education: Goal: Knowledge of General Education information will improve Description Including pain rating scale, medication(s)/side effects and non-pharmacologic comfort measures 06/09/2018 2050 by Reynold Bowen, RN Outcome: Progressing 06/09/2018 2050 by Reynold Bowen, RN Outcome: Progressing

## 2018-06-09 NOTE — Progress Notes (Signed)
   [facility] has been requested to perform a transesophageal echocardiogram on Todd Hooper to rule out subacute bacterial endocarditis per the recommendation of Infectious Disease given MRSA pneumonia/empyema and history of IV drug use.  After careful review of history and examination, the risks and benefits of transesophageal echocardiogram have been explained including risks of esophageal damage, perforation (1:10,000 risk), bleeding, pharyngeal hematoma as well as other potential complications associated with conscious sedation including aspiration, arrhythmia, respiratory failure and death. Alternatives to treatment were discussed, questions were answered. Patient is willing to proceed.   Procedure is scheduled for Friday 06/11/2018 at 12:45 with Dr. Elease Hashimoto.   Corrin Parker, PA-C 06/09/2018 2:22 PM

## 2018-06-09 NOTE — Progress Notes (Addendum)
PROGRESS NOTE  Todd Hooper WOE:[id number] DOB: 14-May-1965 DOA: 06/03/2018 PCP: Patient, No Pcp Per  HPI/Recap of past 24 hours: 53yo M w/a hx of tobacco abuse, IV drug abuse, and cocaine abuse who presented with several days of L chest discomfort and SOB.  In the ED he was found to have a WBC of 11.4, temp of 100.3, HR 110, and RR 30. CTa chest noted a sizable L partially loculated pleural effusion with multifocal infiltrates. An IR guided thoracentesis noted fluid w/ 7551 WBC (96% neutrophils) and LDH 627. The patient underwent a VATS on 06/04/2018 with placement of two drainage catheters.Pleural fluid culture has grown out MRSA.  06/09/2018: Patient seen and examined at his bedside.  Denies any chest pain or dyspnea at rest.  Assessed by cardiothoracic surgery this morning, stable from surgical perspective.  Cardiology has been consulted for possible TEE as recommended by infectious disease.  TEE planned for Friday, 06/11/2018.   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  S/P VATS 06/04/2018 w/ I&D of arm wound as well - pleural fluid has grown out MRSA - arm abscess has grown out MRSA + Kleb - blood cx x2 actually negative - no evidence of endocarditis on TTE - anterior chest tube removed 2/24 - now on RA - will ask ID to weigh in on length of tx suggested (prolonged IV abx use could be problematic due to IVD abuse hx) and need for possible further investigation (one can assume he was bacteremic given - midline IV placed 2/21 may need to be replaced (for presumptive bacteremia) -Repeated blood cultures peripherally x2 as recommended by infectious disease -Consulted cardiology for possible TEE on Friday, 06/11/2018 per ID recommendation -Hepatitis  panel ordered and pending -Obtain CBC with differential in the morning  MRSA empyema Management as stated above Continue IV vancomycin for 10 to 14 days and then switch to oral antibiotics as recommended by ID  Hypovolemic hyponatremia Presented with sodium 131 Improving to 134 Currently on normal saline at 50 cc/h Repeat BMP in the morning  Polysubstance abuse He states that he plans on quitting  Tobacco abuse Continue Nicotine patch  DVT prophylaxis: SCDs Code Status: FULL CODE Family Communication: no family present at time of exam  Disposition Plan: SDU until chest tubes removed   Consultants:  TCTS  Antimicrobials:  Zosyn 2/20 > Vanc 2/20 >    Objective: Vitals:   06/08/18 2013 06/09/18 0013 06/09/18 0334 06/09/18 0843  BP: 123/74 124/79 102/66 123/80  Pulse: 88 81    Resp: 17 (!) 24 16 17   Temp: 99.4 F (37.4 C) 98.2 F (36.8 C) 98.2 F (36.8 C) 97.6 F (36.4 C)  TempSrc: Oral Oral Oral Oral  SpO2: 95%   93%  Weight:      Height:        Intake/Output Summary (Last 24 hours) at 06/09/2018 1004 Last data filed at 06/09/2018 0913 Gross per 24 hour  Intake 1291.13 ml  Output 2870 ml  Net -1578.87 ml   Filed Weights   06/03/18 0907 06/04/18 1023  Weight: 74.8 kg 74.8 kg    Exam:  . General: 53 y.o. year-old male well developed well nourished in no acute distress.  Alert and oriented x3. . Cardiovascular: Regular  rate and rhythm with no rubs or gallops.  No thyromegaly or JVD noted.   Marland Kitchen Respiratory: Clear to auscultation with no wheezes or rales. Good inspiratory effort. . Abdomen: Soft nontender nondistended with normal bowel sounds x4 quadrants. . Musculoskeletal: No lower extremity edema. 2/4 pulses in all 4 extremities. Marland Kitchen Psychiatry: Mood is appropriate for condition and setting   Data Reviewed: CBC: Recent Labs  Lab 06/04/18 0812 06/05/18 0528 06/06/18 0322 06/07/18 0305 06/08/18 0229  WBC 22.1* 13.3* 15.0* 12.7* 12.9*    NEUTROABS  --   --   --  8.5*  --   HGB 12.2* 11.0* 11.0* 11.2* 11.8*  HCT 35.5* 34.6* 33.7* 34.9* 35.5*  MCV 86.8 88.9 88.9 89.3 87.9  PLT 390 432* 441* 481* 490*   Basic Metabolic Panel: Recent Labs  Lab 06/04/18 0812 06/05/18 0528 06/06/18 0322 06/07/18 0305 06/08/18 0229  NA 132* 135 131* 134* 134*  K 4.3 4.0 3.9 3.8 4.2  CL 98 102 97* 100 100  CO2 21*  GLUCOSE 149* 112* 99 130* 106*  BUN 14 7 5* 5* 8  CREATININE 0.76 0.74 0.70 0.73 0.66  CALCIUM 7.9* 7.8* 8.0* 8.1* 8.3*   GFR: Estimated Creatinine Clearance: 104.5 mL/min (by C-G formula based on SCr of 0.66 mg/dL). Liver Function Tests: Recent Labs  Lab 06/03/18 1558 06/06/18 0322  AST  --  21  ALT  --  17  ALKPHOS  --  78  BILITOT  --  0.7  PROT 6.2* 6.0*  ALBUMIN  --  1.8*   No results for input(s): LIPASE, AMYLASE in the last 168 hours. No results for input(s): AMMONIA in the last 168 hours. Coagulation Profile: No results for input(s): INR, PROTIME in the last 168 hours. Cardiac Enzymes: Recent Labs  Lab 06/03/18 0926  TROPONINI <0.03   BNP (last 3 results) No results for input(s): PROBNP in the last 8760 hours. HbA1C: No results for input(s): HGBA1C in the last 72 hours. CBG: No results for input(s): GLUCAP in the last 168 hours. Lipid Profile: No results for input(s): CHOL, HDL, LDLCALC, TRIG, CHOLHDL, LDLDIRECT in the last 72 hours. Thyroid Function Tests: No results for input(s): TSH, T4TOTAL, FREET4, T3FREE, THYROIDAB in the last 72 hours. Anemia Panel: No results for input(s): VITAMINB12, FOLATE, FERRITIN, TIBC, IRON, RETICCTPCT in the last 72 hours. Urine analysis: No results found for: COLORURINE, APPEARANCEUR, LABSPEC, PHURINE, GLUCOSEU, HGBUR, BILIRUBINUR, KETONESUR, PROTEINUR, UROBILINOGEN, NITRITE, LEUKOCYTESUR Sepsis Labs: (procalcitonin:4,lacticidven:4)  ) Recent Results (from the past 240 hour(s))  Blood culture (routine x 2)     Status: None    Collection Time: 06/03/18 11:29 AM  Result Value Ref Range Status   Specimen Description RIGHT ANTECUBITAL  Final   Special Requests   Final    BOTTLES DRAWN AEROBIC AND ANAEROBIC Blood Culture adequate volume   Culture   Final    NO GROWTH 5 DAYS Performed at [facility], [address], [place], [place] [address]    Report Status 06/08/2018 FINAL  Final  Blood culture (routine x 2)     Status: None   Collection Time: 06/03/18 11:29 AM  Result Value Ref Range Status   Specimen Description BLOOD RIGHT WRIST  Final   Special Requests   Final    BOTTLES DRAWN AEROBIC AND ANAEROBIC Blood Culture adequate volume   Culture   Final    NO GROWTH 5 DAYS Performed at [facility], [address] [place], [place] [address]  Report Status 06/08/2018 FINAL  Final  Fungus Culture With Stain     Status: None (Preliminary result)   Collection Time: 06/03/18  1:58 PM  Result Value Ref Range Status   Fungus Stain Final report  Final    Comment: (NOTE) Performed At: [facility] [address] [place], [place] [id number] Jolene Schimke MD WU:[id number]    Fungus (Mycology) Culture PENDING  Incomplete   Fungal Source PLEURAL  Final    Comment: LEFT Performed at [facility], [address] [place], [place] [address]   Acid Fast Smear (AFB)     Status: None   Collection Time: 06/03/18  1:58 PM  Result Value Ref Range Status   AFB Specimen Processing Concentration  Final   Acid Fast Smear Negative  Final    Comment: (NOTE) Performed At: [facility] [address] [place], [place] [id number] Jolene Schimke MD [id number]    Source (AFB) PLEURAL  Final    Comment: LEFT Performed at [facility], [address], [place], [place] [address]   Gram stain     Status: None   Collection Time: 06/03/18  1:58 PM  Result Value Ref Range Status   Specimen Description PLEURAL LEFT  Final   Special Requests NONE  Final   Gram Stain   Final    CYTOSPIN SMEAR NO ORGANISMS  SEEN WBC PRESENT,BOTH PMN AND MONONUCLEAR Performed at [facility], [address] [place], [place] [address]    Report Status 06/03/2018 FINAL  Final  Culture, body fluid-bottle     Status: Abnormal   Collection Time: 06/03/18  1:58 PM  Result Value Ref Range Status   Specimen Description   Final    PLEURAL LEFT Performed at [facility], [address] [place], [place] [address]    Special Requests   Final    BOTTLES DRAWN AEROBIC AND ANAEROBIC 10CC Performed at [facility], [address], [place], [place] [address]    Gram Stain   Final    GRAM POSITIVE COCCI IN CLUSTERS WBC PRESENT, PREDOMINANTLY PMN BOTH ANA AND AEB BOTTLES Gram Stain Report Called to,Read Back By and Verified With: GRAHAM,A AT 0655 ON 06/04/18 BY HUFFINES,S. Performed at [facility], [address], [place], [place] [address]    Culture METHICILLIN RESISTANT STAPHYLOCOCCUS AUREUS (A)  Final   Report Status 06/06/2018 FINAL  Final   Organism ID, Bacteria METHICILLIN RESISTANT STAPHYLOCOCCUS AUREUS  Final      Susceptibility   Methicillin resistant staphylococcus aureus - MIC*    CIPROFLOXACIN >=8 RESISTANT Resistant     ERYTHROMYCIN >=8 RESISTANT Resistant     GENTAMICIN <=0.5 SENSITIVE Sensitive     OXACILLIN >=4 RESISTANT Resistant     TETRACYCLINE <=1 SENSITIVE Sensitive     VANCOMYCIN <=0.5 SENSITIVE Sensitive     TRIMETH/SULFA <=10 SENSITIVE Sensitive     CLINDAMYCIN >=8 RESISTANT Resistant     RIFAMPIN <=0.5 SENSITIVE Sensitive     Inducible Clindamycin NEGATIVE Sensitive     * METHICILLIN RESISTANT STAPHYLOCOCCUS AUREUS  Fungus Culture Result     Status: None   Collection Time: 06/03/18  1:58 PM  Result Value Ref Range Status   Result 1 Comment  Final    Comment: (NOTE) KOH/Calcofluor preparation:  no fungus observed. Performed At: [facility] [address] [place], [place] [id number] Jolene Schimke MD JO:[id number]   Aerobic Culture (superficial specimen)     Status:  None   Collection Time: 06/03/18  5:23 PM  Result Value Ref Range Status   Specimen Description   Final    ABSCESS Performed at [facility], [address], [place], [place] [address]    Special Requests   Final    NONE Performed at [facility], [address], [place], [place] [address]    Gram Stain   Final    ABUNDANT WBC PRESENT, PREDOMINANTLY PMN RARE GRAM POSITIVE COCCI Performed at [facility], [address] [place], [place] [address]    Culture   Final    ABUNDANT METHICILLIN RESISTANT STAPHYLOCOCCUS AUREUS FEW KLEBSIELLA PNEUMONIAE    Report Status 06/06/2018 FINAL  Final   Organism ID, Bacteria KLEBSIELLA PNEUMONIAE  Final   Organism ID, Bacteria METHICILLIN RESISTANT STAPHYLOCOCCUS AUREUS  Final      Susceptibility   Klebsiella pneumoniae - MIC*    AMPICILLIN >=32 RESISTANT Resistant     CEFAZOLIN <=4 SENSITIVE Sensitive     CEFEPIME <=1 SENSITIVE Sensitive     CEFTAZIDIME <=1 SENSITIVE Sensitive     CEFTRIAXONE <=1 SENSITIVE Sensitive     CIPROFLOXACIN <=0.25 SENSITIVE Sensitive     GENTAMICIN <=1 SENSITIVE Sensitive     IMIPENEM <=0.25 SENSITIVE Sensitive     TRIMETH/SULFA <=20 SENSITIVE Sensitive     AMPICILLIN/SULBACTAM 4 SENSITIVE Sensitive     PIP/TAZO <=4 SENSITIVE Sensitive     Extended ESBL NEGATIVE Sensitive     * FEW KLEBSIELLA PNEUMONIAE   Methicillin resistant staphylococcus aureus - MIC*    CIPROFLOXACIN >=8 RESISTANT Resistant     ERYTHROMYCIN >=8 RESISTANT Resistant     GENTAMICIN <=0.5 SENSITIVE Sensitive     OXACILLIN >=4 RESISTANT Resistant     TETRACYCLINE <=1 SENSITIVE Sensitive     VANCOMYCIN 1 SENSITIVE Sensitive     TRIMETH/SULFA <=10 SENSITIVE Sensitive     CLINDAMYCIN >=8 RESISTANT Resistant     RIFAMPIN <=0.5 SENSITIVE Sensitive     Inducible Clindamycin NEGATIVE Sensitive     * ABUNDANT METHICILLIN RESISTANT STAPHYLOCOCCUS AUREUS  MRSA PCR Screening     Status: None   Collection Time: 06/04/18 10:01 AM  Result Value  Ref Range Status   MRSA by PCR NEGATIVE NEGATIVE Final    Comment:        The GeneXpert MRSA Assay (FDA approved for NASAL specimens only), is one component of a comprehensive MRSA colonization surveillance program. It is not intended to diagnose MRSA infection nor to guide or monitor treatment for MRSA infections. Performed at [facility], [address], [place], [place] [address]   Aerobic Culture (superficial specimen)     Status: None   Collection Time: 06/04/18 11:51 AM  Result Value Ref Range Status   Specimen Description WOUND  Final   Special Requests NONE  Final   Gram Stain   Final    FEW WBC PRESENT,BOTH PMN AND MONONUCLEAR NO ORGANISMS SEEN RESULT CALLED TO, READ BACK BY AND VERIFIED WITH: Wynetta Fines MD 12:35 06/04/18 ([email or web address]) Performed at [facility], [address] [place], [place] [address]    Culture   Final    FEW METHICILLIN RESISTANT STAPHYLOCOCCUS AUREUS RARE KLEBSIELLA PNEUMONIAE    Report Status 06/06/2018 FINAL  Final   Organism ID, Bacteria KLEBSIELLA PNEUMONIAE  Final   Organism ID, Bacteria METHICILLIN RESISTANT STAPHYLOCOCCUS AUREUS  Final      Susceptibility   Klebsiella pneumoniae - MIC*    AMPICILLIN >=32 RESISTANT Resistant     CEFAZOLIN <=4 SENSITIVE Sensitive     CEFEPIME <=  1 SENSITIVE Sensitive     CEFTAZIDIME <=1 SENSITIVE Sensitive     CEFTRIAXONE <=1 SENSITIVE Sensitive     CIPROFLOXACIN <=0.25 SENSITIVE Sensitive     GENTAMICIN <=1 SENSITIVE Sensitive     IMIPENEM <=0.25 SENSITIVE Sensitive     TRIMETH/SULFA <=20 SENSITIVE Sensitive     AMPICILLIN/SULBACTAM 8 SENSITIVE Sensitive     PIP/TAZO <=4 SENSITIVE Sensitive     Extended ESBL NEGATIVE Sensitive     * RARE KLEBSIELLA PNEUMONIAE   Methicillin resistant staphylococcus aureus - MIC*    CIPROFLOXACIN >=8 RESISTANT Resistant     ERYTHROMYCIN >=8 RESISTANT Resistant     GENTAMICIN <=0.5 SENSITIVE Sensitive     OXACILLIN RESISTANT Resistant     TETRACYCLINE <=1  SENSITIVE Sensitive     VANCOMYCIN <=0.5 SENSITIVE Sensitive     TRIMETH/SULFA <=10 SENSITIVE Sensitive     CLINDAMYCIN >=8 RESISTANT Resistant     RIFAMPIN <=0.5 SENSITIVE Sensitive     Inducible Clindamycin NEGATIVE Sensitive     * FEW METHICILLIN RESISTANT STAPHYLOCOCCUS AUREUS  Culture, respiratory     Status: None   Collection Time: 06/04/18 12:02 PM  Result Value Ref Range Status   Specimen Description BRONCHIAL WASHINGS  Final   Special Requests NONE  Final   Gram Stain   Final    RARE WBC PRESENT, PREDOMINANTLY PMN FEW SQUAMOUS EPITHELIAL CELLS PRESENT FEW GRAM POSITIVE RODS RARE GRAM POSITIVE COCCI RARE GRAM NEGATIVE RODS    Culture   Final    FEW Consistent with normal respiratory flora. Performed at [facility], [address] [place], [place] [address]    Report Status 06/06/2018 FINAL  Final  Acid Fast Smear (AFB)     Status: None   Collection Time: 06/04/18 12:02 PM  Result Value Ref Range Status   AFB Specimen Processing Concentration  Final   Acid Fast Smear Negative  Final    Comment: (NOTE) Performed At: [facility] [address] [place], [place] [id number] Jolene SchimkeNagendra Sanjai MD XB:[id number]:[phone]    Source (AFB) PLEURAL  Final  Body fluid culture     Status: None   Collection Time: 06/04/18 12:31 PM  Result Value Ref Range Status   Specimen Description PLEURAL  Final   Special Requests LEFT  Final   Gram Stain   Final    FEW WBC PRESENT,BOTH PMN AND MONONUCLEAR MODERATE GRAM POSITIVE COCCI Performed at [facility], [address] [place], [place] [address]    Culture FEW METHICILLIN RESISTANT STAPHYLOCOCCUS AUREUS  Final   Report Status 06/06/2018 FINAL  Final   Organism ID, Bacteria METHICILLIN RESISTANT STAPHYLOCOCCUS AUREUS  Final      Susceptibility   Methicillin resistant staphylococcus aureus - MIC*    CIPROFLOXACIN >=8 RESISTANT Resistant     ERYTHROMYCIN >=8 RESISTANT Resistant     GENTAMICIN <=0.5 SENSITIVE Sensitive      OXACILLIN >=4 RESISTANT Resistant     TETRACYCLINE <=1 SENSITIVE Sensitive     VANCOMYCIN <=0.5 SENSITIVE Sensitive     TRIMETH/SULFA <=10 SENSITIVE Sensitive     CLINDAMYCIN >=8 RESISTANT Resistant     RIFAMPIN <=0.5 SENSITIVE Sensitive     Inducible Clindamycin NEGATIVE Sensitive     * FEW METHICILLIN RESISTANT STAPHYLOCOCCUS AUREUS  Aerobic/Anaerobic Culture (surgical/deep wound)     Status: None (Preliminary result)   Collection Time: 06/04/18 12:31 PM  Result Value Ref Range Status   Specimen Description PLEURAL  Final   Special Requests NONE  Final  Gram Stain   Final    ABUNDANT WBC PRESENT,BOTH PMN AND MONONUCLEAR RARE GRAM POSITIVE COCCI Performed at [facility], [address], [place], [place] [address]    Culture   Final    MODERATE METHICILLIN RESISTANT STAPHYLOCOCCUS AUREUS NO ANAEROBES ISOLATED; CULTURE IN PROGRESS FOR 5 DAYS    Report Status PENDING  Incomplete   Organism ID, Bacteria METHICILLIN RESISTANT STAPHYLOCOCCUS AUREUS  Final      Susceptibility   Methicillin resistant staphylococcus aureus - MIC*    CIPROFLOXACIN >=8 RESISTANT Resistant     ERYTHROMYCIN >=8 RESISTANT Resistant     GENTAMICIN <=0.5 SENSITIVE Sensitive     OXACILLIN RESISTANT Resistant     TETRACYCLINE <=1 SENSITIVE Sensitive     VANCOMYCIN <=0.5 SENSITIVE Sensitive     TRIMETH/SULFA <=10 SENSITIVE Sensitive     CLINDAMYCIN >=8 RESISTANT Resistant     RIFAMPIN <=0.5 SENSITIVE Sensitive     Inducible Clindamycin NEGATIVE Sensitive     * MODERATE METHICILLIN RESISTANT STAPHYLOCOCCUS AUREUS  Acid Fast Smear (AFB)     Status: None   Collection Time: 06/04/18 12:31 PM  Result Value Ref Range Status   AFB Specimen Processing Concentration  Final   Acid Fast Smear Negative  Final    Comment: (NOTE) Performed At: [facility] [address] [place], [place] [id number] Jolene Schimke MD EA:[id number]    Source (AFB) BRONCHIAL WASHINGS  Final  Acid Fast Smear (AFB)      Status: None   Collection Time: 06/04/18 12:35 PM  Result Value Ref Range Status   AFB Specimen Processing Concentration  Final   Acid Fast Smear Negative  Final    Comment: (NOTE) Performed At: [facility] [address] [place], [place] [id number] Jolene Schimke MD KI:[id number]    Source (AFB) PLEURAL  Final  Culture, blood (routine x 2)     Status: None (Preliminary result)   Collection Time: 06/09/18  6:25 AM  Result Value Ref Range Status   Specimen Description BLOOD LEFT HAND  Final   Special Requests   Final    BOTTLES DRAWN AEROBIC ONLY Blood Culture adequate volume   Culture PENDING  Incomplete   Report Status PENDING  Incomplete  Culture, blood (routine x 2)     Status: None (Preliminary result)   Collection Time: 06/09/18  6:33 AM  Result Value Ref Range Status   Specimen Description BLOOD LEFT HAND  Final   Special Requests   Final    BOTTLES DRAWN AEROBIC ONLY Blood Culture adequate volume   Culture PENDING  Incomplete   Report Status PENDING  Incomplete      Studies: Dg Chest 2 View  Result Date: 06/08/2018 CLINICAL DATA:  Left chest tube removal EXAM: CHEST - 2 VIEW COMPARISON:  06/08/2018 FINDINGS: Interval removal of left chest tube. No visible pneumothorax. Left pleural effusion or pleural thickening laterally again noted, stable. Left lower lobe opacity is stable. Linear atelectasis or scarring at the right base. Heart is normal size. IMPRESSION: Interval removal of left chest tube without visible pneumothorax. Otherwise no change. Electronically Signed   By: Charlett Nose M.D.   On: 06/08/2018 17:33    Scheduled Meds: . bisacodyl  10 mg Oral Daily  . gabapentin  300 mg Oral TID  . guaiFENesin  600 mg Oral BID  . hydrOXYzine  25 mg Oral TID  . hydrOXYzine  50 mg Oral QHS  . methocarbamol  750 mg Oral Q6H  . multivitamin with  minerals  1 tablet Oral Daily  . nicotine  21 mg Transdermal Daily  . polyethylene glycol  17 g Oral Daily  . senna  2  tablet Oral QHS  . senna-docusate  1 tablet Oral QHS  . sodium chloride flush  10-40 mL Intracatheter Q12H    Continuous Infusions: . sodium chloride Stopped (06/09/18 0527)  .  ceFAZolin (ANCEF) IV 200 mL/hr at 06/09/18 0529  . potassium chloride    . vancomycin 1,000 mg (06/09/18 0913)     LOS: 6 days     Darlin Drop, MD [facility] Pager [phone]  If 7PM-7AM, please contact night-coverage [email or web address] Password [email or web address] 06/09/2018, 10:04 AM

## 2018-06-09 NOTE — Progress Notes (Addendum)
[address]       [place] [address]             [phone]      5 Days Post-Op Procedure(s) (LRB): VIDEO BRONCHOSCOPY (N/A) Packing of Left Upper Arm Wound (Left) Left Video Assisted Thoracoscopy (Vats)/Decortication and Drainage of Empyema (Left) Subjective: Moderate discomfort this am  Objective: Vital signs in last 24 hours: Temp:  [98.2 F (36.8 C)-99.4 F (37.4 C)] 98.2 F (36.8 C) (02/26 0334) Pulse Rate:  [81-88] 81 (02/26 0013) Cardiac Rhythm: Normal sinus rhythm (02/26 0425) Resp:  [16-24] 16 (02/26 0334) BP: (102-137)/(66-96) 102/66 (02/26 0334) SpO2:  [95 %-98 %] 95 % (02/25 2013)  Hemodynamic parameters for last 24 hours:    Intake/Output from previous day: 02/25 0701 - 02/26 0700 In: 1411.1 [P.O.:360; I.V.:646.6; IV Piggyback:404.5] Out: 4185 [Urine:4175; Chest Tube:10] Intake/Output this shift: No intake/output data recorded.  General appearance: alert, cooperative and no distress Heart: regular rate and rhythm Lungs: dim left base Abdomen: benign exam Extremities: no edema or calf tenderness Wound: incis healing well  Lab Results: Recent Labs    06/07/18 0305 06/08/18 0229  WBC 12.7* 12.9*  HGB 11.2* 11.8*  HCT 34.9* 35.5*  PLT 481* 490*   BMET:  Recent Labs    06/07/18 0305 06/08/18 0229  NA 134* 134*  K 3.8 4.2  CL 100 100  CO2 26 21*  GLUCOSE 130* 106*  BUN 5* 8  CREATININE 0.73 0.66  CALCIUM 8.1* 8.3*    PT/INR: No results for input(s): LABPROT, INR in the last 72 hours. ABG    Component Value Date/Time   PHART 7.467 (H) 06/05/2018 0450   HCO3 26.6 06/05/2018 0450   O2SAT 93.6 06/05/2018 0450   CBG (last 3)  No results for input(s): GLUCAP in the last 72 hours.  Recent Results (from the past 720 hour(s))  Blood culture (routine x 2)     Status: None   Collection Time: 06/03/18 11:29 AM  Result Value Ref Range Status   Specimen Description RIGHT ANTECUBITAL  Final   Special Requests   Final     BOTTLES DRAWN AEROBIC AND ANAEROBIC Blood Culture adequate volume   Culture   Final    NO GROWTH 5 DAYS Performed at [facility], [address], [place], [place] [address]    Report Status 06/08/2018 FINAL  Final  Blood culture (routine x 2)     Status: None   Collection Time: 06/03/18 11:29 AM  Result Value Ref Range Status   Specimen Description BLOOD RIGHT WRIST  Final   Special Requests   Final    BOTTLES DRAWN AEROBIC AND ANAEROBIC Blood Culture adequate volume   Culture   Final    NO GROWTH 5 DAYS Performed at [facility], [address], [place], [place] [address]    Report Status 06/08/2018 FINAL  Final  Fungus Culture With Stain     Status: None (Preliminary result)   Collection Time: 06/03/18  1:58 PM  Result Value Ref Range Status   Fungus Stain Final report  Final    Comment: (NOTE) Performed At: [facility] [address] [place], [place] [id number] Jolene Schimke MD ON:[id number]    Fungus (Mycology) Culture PENDING  Incomplete   Fungal Source PLEURAL  Final    Comment: LEFT Performed at [facility], [address] [place], [place] [address]   Acid Fast Smear (AFB)     Status: None  Collection Time: 06/03/18  1:58 PM  Result Value Ref Range Status   AFB Specimen Processing Concentration  Final   Acid Fast Smear Negative  Final    Comment: (NOTE) Performed At: [facility] [address] [place], [place] [id number] Jolene Schimke MD NZ:[id number]    Source (AFB) PLEURAL  Final    Comment: LEFT Performed at [facility], [address], [place], [place] [address]   Gram stain     Status: None   Collection Time: 06/03/18  1:58 PM  Result Value Ref Range Status   Specimen Description PLEURAL LEFT  Final   Special Requests NONE  Final   Gram Stain   Final    CYTOSPIN SMEAR NO ORGANISMS SEEN WBC PRESENT,BOTH PMN AND MONONUCLEAR Performed at [facility], [address] [place], [place] [address]    Report Status 06/03/2018 FINAL   Final  Culture, body fluid-bottle     Status: Abnormal   Collection Time: 06/03/18  1:58 PM  Result Value Ref Range Status   Specimen Description   Final    PLEURAL LEFT Performed at [facility], [address], [place], [place] [address]    Special Requests   Final    BOTTLES DRAWN AEROBIC AND ANAEROBIC 10CC Performed at [facility], [address] [place], [place] [address]    Gram Stain   Final    GRAM POSITIVE COCCI IN CLUSTERS WBC PRESENT, PREDOMINANTLY PMN BOTH ANA AND AEB BOTTLES Gram Stain Report Called to,Read Back By and Verified With: GRAHAM,A AT 0655 ON 06/04/18 BY HUFFINES,S. Performed at [facility], [address], [place], [place] [address]    Culture METHICILLIN RESISTANT STAPHYLOCOCCUS AUREUS (A)  Final   Report Status 06/06/2018 FINAL  Final   Organism ID, Bacteria METHICILLIN RESISTANT STAPHYLOCOCCUS AUREUS  Final      Susceptibility   Methicillin resistant staphylococcus aureus - MIC*    CIPROFLOXACIN >=8 RESISTANT Resistant     ERYTHROMYCIN >=8 RESISTANT Resistant     GENTAMICIN <=0.5 SENSITIVE Sensitive     OXACILLIN >=4 RESISTANT Resistant     TETRACYCLINE <=1 SENSITIVE Sensitive     VANCOMYCIN <=0.5 SENSITIVE Sensitive     TRIMETH/SULFA <=10 SENSITIVE Sensitive     CLINDAMYCIN >=8 RESISTANT Resistant     RIFAMPIN <=0.5 SENSITIVE Sensitive     Inducible Clindamycin NEGATIVE Sensitive     * METHICILLIN RESISTANT STAPHYLOCOCCUS AUREUS  Fungus Culture Result     Status: None   Collection Time: 06/03/18  1:58 PM  Result Value Ref Range Status   Result 1 Comment  Final    Comment: (NOTE) KOH/Calcofluor preparation:  no fungus observed. Performed At: [facility] [address] [place], [place] [id number] Jolene Schimke MD KF:[id number]   Aerobic Culture (superficial specimen)     Status: None   Collection Time: 06/03/18  5:23 PM  Result Value Ref Range Status   Specimen Description   Final    ABSCESS Performed at [facility], [address] [place], [place] [address]    Special Requests   Final    NONE Performed at [facility], [address] [place], [place] [address]    Gram Stain   Final    ABUNDANT WBC PRESENT, PREDOMINANTLY PMN RARE GRAM POSITIVE COCCI Performed at [facility], [address], [place], [place] [address]    Culture   Final    ABUNDANT METHICILLIN RESISTANT STAPHYLOCOCCUS AUREUS FEW KLEBSIELLA PNEUMONIAE    Report Status 06/06/2018  FINAL  Final   Organism ID, Bacteria KLEBSIELLA PNEUMONIAE  Final   Organism ID, Bacteria METHICILLIN RESISTANT STAPHYLOCOCCUS AUREUS  Final      Susceptibility   Klebsiella pneumoniae - MIC*    AMPICILLIN >=32 RESISTANT Resistant     CEFAZOLIN <=4 SENSITIVE Sensitive     CEFEPIME <=1 SENSITIVE Sensitive     CEFTAZIDIME <=1 SENSITIVE Sensitive     CEFTRIAXONE <=1 SENSITIVE Sensitive     CIPROFLOXACIN <=0.25 SENSITIVE Sensitive     GENTAMICIN <=1 SENSITIVE Sensitive     IMIPENEM <=0.25 SENSITIVE Sensitive     TRIMETH/SULFA <=20 SENSITIVE Sensitive     AMPICILLIN/SULBACTAM 4 SENSITIVE Sensitive     PIP/TAZO <=4 SENSITIVE Sensitive     Extended ESBL NEGATIVE Sensitive     * FEW KLEBSIELLA PNEUMONIAE   Methicillin resistant staphylococcus aureus - MIC*    CIPROFLOXACIN >=8 RESISTANT Resistant     ERYTHROMYCIN >=8 RESISTANT Resistant     GENTAMICIN <=0.5 SENSITIVE Sensitive     OXACILLIN >=4 RESISTANT Resistant     TETRACYCLINE <=1 SENSITIVE Sensitive     VANCOMYCIN 1 SENSITIVE Sensitive     TRIMETH/SULFA <=10 SENSITIVE Sensitive     CLINDAMYCIN >=8 RESISTANT Resistant     RIFAMPIN <=0.5 SENSITIVE Sensitive     Inducible Clindamycin NEGATIVE Sensitive     * ABUNDANT METHICILLIN RESISTANT STAPHYLOCOCCUS AUREUS  MRSA PCR Screening     Status: None   Collection Time: 06/04/18 10:01 AM  Result Value Ref Range Status   MRSA by PCR NEGATIVE NEGATIVE Final    Comment:        The GeneXpert MRSA Assay (FDA approved for NASAL specimens only),  is one component of a comprehensive MRSA colonization surveillance program. It is not intended to diagnose MRSA infection nor to guide or monitor treatment for MRSA infections. Performed at [facility], [address] [place], [place] [address]   Aerobic Culture (superficial specimen)     Status: None   Collection Time: 06/04/18 11:51 AM  Result Value Ref Range Status   Specimen Description WOUND  Final   Special Requests NONE  Final   Gram Stain   Final    FEW WBC PRESENT,BOTH PMN AND MONONUCLEAR NO ORGANISMS SEEN RESULT CALLED TO, READ BACK BY AND VERIFIED WITH: Wynetta Fines MD 12:35 06/04/18 ([email or web address]) Performed at [facility], [address] [place], [place] [address]    Culture   Final    FEW METHICILLIN RESISTANT STAPHYLOCOCCUS AUREUS RARE KLEBSIELLA PNEUMONIAE    Report Status 06/06/2018 FINAL  Final   Organism ID, Bacteria KLEBSIELLA PNEUMONIAE  Final   Organism ID, Bacteria METHICILLIN RESISTANT STAPHYLOCOCCUS AUREUS  Final      Susceptibility   Klebsiella pneumoniae - MIC*    AMPICILLIN >=32 RESISTANT Resistant     CEFAZOLIN <=4 SENSITIVE Sensitive     CEFEPIME <=1 SENSITIVE Sensitive     CEFTAZIDIME <=1 SENSITIVE Sensitive     CEFTRIAXONE <=1 SENSITIVE Sensitive     CIPROFLOXACIN <=0.25 SENSITIVE Sensitive     GENTAMICIN <=1 SENSITIVE Sensitive     IMIPENEM <=0.25 SENSITIVE Sensitive     TRIMETH/SULFA <=20 SENSITIVE Sensitive     AMPICILLIN/SULBACTAM 8 SENSITIVE Sensitive     PIP/TAZO <=4 SENSITIVE Sensitive     Extended ESBL NEGATIVE Sensitive     * RARE KLEBSIELLA PNEUMONIAE   Methicillin resistant staphylococcus aureus - MIC*    CIPROFLOXACIN >=8 RESISTANT Resistant     ERYTHROMYCIN >=8 RESISTANT Resistant  GENTAMICIN <=0.5 SENSITIVE Sensitive     OXACILLIN RESISTANT Resistant     TETRACYCLINE <=1 SENSITIVE Sensitive     VANCOMYCIN <=0.5 SENSITIVE Sensitive     TRIMETH/SULFA <=10 SENSITIVE Sensitive     CLINDAMYCIN >=8 RESISTANT  Resistant     RIFAMPIN <=0.5 SENSITIVE Sensitive     Inducible Clindamycin NEGATIVE Sensitive     * FEW METHICILLIN RESISTANT STAPHYLOCOCCUS AUREUS  Culture, respiratory     Status: None   Collection Time: 06/04/18 12:02 PM  Result Value Ref Range Status   Specimen Description BRONCHIAL WASHINGS  Final   Special Requests NONE  Final   Gram Stain   Final    RARE WBC PRESENT, PREDOMINANTLY PMN FEW SQUAMOUS EPITHELIAL CELLS PRESENT FEW GRAM POSITIVE RODS RARE GRAM POSITIVE COCCI RARE GRAM NEGATIVE RODS    Culture   Final    FEW Consistent with normal respiratory flora. Performed at [facility], [address], [place], [place] [address]    Report Status 06/06/2018 FINAL  Final  Acid Fast Smear (AFB)     Status: None   Collection Time: 06/04/18 12:02 PM  Result Value Ref Range Status   AFB Specimen Processing Concentration  Final   Acid Fast Smear Negative  Final    Comment: (NOTE) Performed At: [facility] [address] [place], [place] [id number] Jolene Schimke MD XB:[id number]    Source (AFB) PLEURAL  Final  Body fluid culture     Status: None   Collection Time: 06/04/18 12:31 PM  Result Value Ref Range Status   Specimen Description PLEURAL  Final   Special Requests LEFT  Final   Gram Stain   Final    FEW WBC PRESENT,BOTH PMN AND MONONUCLEAR MODERATE GRAM POSITIVE COCCI Performed at [facility], [address] [place], [place] [address]    Culture FEW METHICILLIN RESISTANT STAPHYLOCOCCUS AUREUS  Final   Report Status 06/06/2018 FINAL  Final   Organism ID, Bacteria METHICILLIN RESISTANT STAPHYLOCOCCUS AUREUS  Final      Susceptibility   Methicillin resistant staphylococcus aureus - MIC*    CIPROFLOXACIN >=8 RESISTANT Resistant     ERYTHROMYCIN >=8 RESISTANT Resistant     GENTAMICIN <=0.5 SENSITIVE Sensitive     OXACILLIN >=4 RESISTANT Resistant     TETRACYCLINE <=1 SENSITIVE Sensitive     VANCOMYCIN <=0.5 SENSITIVE Sensitive      TRIMETH/SULFA <=10 SENSITIVE Sensitive     CLINDAMYCIN >=8 RESISTANT Resistant     RIFAMPIN <=0.5 SENSITIVE Sensitive     Inducible Clindamycin NEGATIVE Sensitive     * FEW METHICILLIN RESISTANT STAPHYLOCOCCUS AUREUS  Aerobic/Anaerobic Culture (surgical/deep wound)     Status: None (Preliminary result)   Collection Time: 06/04/18 12:31 PM  Result Value Ref Range Status   Specimen Description PLEURAL  Final   Special Requests NONE  Final   Gram Stain   Final    ABUNDANT WBC PRESENT,BOTH PMN AND MONONUCLEAR RARE GRAM POSITIVE COCCI Performed at [facility], [address], [place], [place] [address]    Culture   Final    MODERATE METHICILLIN RESISTANT STAPHYLOCOCCUS AUREUS NO ANAEROBES ISOLATED; CULTURE IN PROGRESS FOR 5 DAYS    Report Status PENDING  Incomplete   Organism ID, Bacteria METHICILLIN RESISTANT STAPHYLOCOCCUS AUREUS  Final      Susceptibility   Methicillin resistant staphylococcus aureus - MIC*    CIPROFLOXACIN >=8 RESISTANT Resistant     ERYTHROMYCIN >=8 RESISTANT Resistant     GENTAMICIN <=0.5  SENSITIVE Sensitive     OXACILLIN RESISTANT Resistant     TETRACYCLINE <=1 SENSITIVE Sensitive     VANCOMYCIN <=0.5 SENSITIVE Sensitive     TRIMETH/SULFA <=10 SENSITIVE Sensitive     CLINDAMYCIN >=8 RESISTANT Resistant     RIFAMPIN <=0.5 SENSITIVE Sensitive     Inducible Clindamycin NEGATIVE Sensitive     * MODERATE METHICILLIN RESISTANT STAPHYLOCOCCUS AUREUS  Acid Fast Smear (AFB)     Status: None   Collection Time: 06/04/18 12:31 PM  Result Value Ref Range Status   AFB Specimen Processing Concentration  Final   Acid Fast Smear Negative  Final    Comment: (NOTE) Performed At: [facility] [address] [place], [place] [id number] Jolene Schimke MD NW:[id number]    Source (AFB) BRONCHIAL WASHINGS  Final  Acid Fast Smear (AFB)     Status: None   Collection Time: 06/04/18 12:35 PM  Result Value Ref Range Status   AFB Specimen Processing Concentration   Final   Acid Fast Smear Negative  Final    Comment: (NOTE) Performed At: [facility] [address] [place], [place] [id number] Jolene Schimke MD BM:[id number]    Source (AFB) PLEURAL  Final  Culture, blood (routine x 2)     Status: None (Preliminary result)   Collection Time: 06/09/18  6:25 AM  Result Value Ref Range Status   Specimen Description BLOOD LEFT HAND  Final   Special Requests   Final    BOTTLES DRAWN AEROBIC ONLY Blood Culture adequate volume   Culture PENDING  Incomplete   Report Status PENDING  Incomplete  Culture, blood (routine x 2)     Status: None (Preliminary result)   Collection Time: 06/09/18  6:33 AM  Result Value Ref Range Status   Specimen Description BLOOD LEFT HAND  Final   Special Requests   Final    BOTTLES DRAWN AEROBIC ONLY Blood Culture adequate volume   Culture PENDING  Incomplete   Report Status PENDING  Incomplete     Meds Scheduled Meds: . bisacodyl  10 mg Oral Daily  . gabapentin  300 mg Oral TID  . guaiFENesin  600 mg Oral BID  . hydrOXYzine  25 mg Oral TID  . hydrOXYzine  50 mg Oral QHS  . methocarbamol  750 mg Oral Q6H  . multivitamin with minerals  1 tablet Oral Daily  . nicotine  21 mg Transdermal Daily  . polyethylene glycol  17 g Oral Daily  . senna  2 tablet Oral QHS  . senna-docusate  1 tablet Oral QHS  . sodium chloride flush  10-40 mL Intracatheter Q12H   Continuous Infusions: . sodium chloride Stopped (06/09/18 0527)  .  ceFAZolin (ANCEF) IV 200 mL/hr at 06/09/18 0529  . potassium chloride    . vancomycin Stopped (06/09/18 0524)   PRN Meds:.acetaminophen, guaiFENesin-codeine, ipratropium-albuterol, ondansetron **OR** [DISCONTINUED] ondansetron (ZOFRAN) IV, oxyCODONE, potassium chloride, traMADol, traZODone  Xrays Dg Chest 2 View  Result Date: 06/08/2018 CLINICAL DATA:  Left chest tube removal EXAM: CHEST - 2 VIEW COMPARISON:  06/08/2018 FINDINGS: Interval removal of left chest tube. No visible pneumothorax.  Left pleural effusion or pleural thickening laterally again noted, stable. Left lower lobe opacity is stable. Linear atelectasis or scarring at the right base. Heart is normal size. IMPRESSION: Interval removal of left chest tube without visible pneumothorax. Otherwise no change. Electronically Signed   By: Charlett Nose M.D.   On: 06/08/2018 17:33   Dg Chest Port 1 View  Result Date: 06/08/2018  CLINICAL DATA:  Chest tube placement EXAM: PORTABLE CHEST 1 VIEW COMPARISON:  06/07/2018 FINDINGS: Left chest tube is in place. A tiny residual left apical pneumothorax measures approximately 5 mm. Heart size is enlarged. Extensive airspace disease within the left lower lung is unchanged from previous exam. Platelike atelectasis within the right base is unchanged. IMPRESSION: 1. Left chest tube in place with tiny left apical pneumothorax. 2. No change in aeration to the left lower lung. Electronically Signed   By: Signa Kell M.D.   On: 06/08/2018 09:23   Dg Chest Port 1 View  Result Date: 06/07/2018 CLINICAL DATA:  Chest tube in place. EXAM: PORTABLE CHEST 1 VIEW COMPARISON:  Radiographs of June 06, 2018. FINDINGS: Stable cardiomediastinal silhouette. Left-sided chest tube is unchanged in position. Mild left basilar atelectasis or infiltrate is noted with small associated pleural effusion or pleural thickening. Stable minimal right basilar subsegmental atelectasis. No pneumothorax is noted. Bony thorax is unremarkable. IMPRESSION: Stable left basilar atelectasis or infiltrate is noted, with associated small pleural effusion or pleural thickening. Left-sided chest tube is unchanged in position without pneumothorax. Stable minimal right basilar subsegmental atelectasis. Electronically Signed   By: Lupita Raider, M.D.   On: 06/07/2018 13:59    Assessment/Plan: S/P Procedure(s) (LRB): VIDEO BRONCHOSCOPY (N/A) Packing of Left Upper Arm Wound (Left) Left Video Assisted Thoracoscopy (Vats)/Decortication and  Drainage of Empyema (Left)  1 clinically stable, hemodyn stable in sinus rhythm without fevers 2 sats good on RA, CXR showing slow but steady improvement- chest tube are out 3 conts ID recs for ABX, management of infectious process 4 Medical management per primary service 5 multiple social issues, substance abuse/poverty/homelessness- CM and SW assistance 6 cont rehab and pulm toilet- very stable from surgical perspective   LOS: 6 days    Rowe Clack [facility] 06/09/2018 Pager [phone]  I have seen and examined Todd Hooper and agree with the above assessment  and plan.  Delight Ovens MD Beeper [phone] Office [phone] 06/09/2018 2:24 PM

## 2018-06-10 LAB — HEPATITIS PANEL, ACUTE
HCV Ab: >11 s/co ratio — ABNORMAL HIGH (ref 0.0–0.9)
Hep A IgM: NEGATIVE
Hep B C IgM: NEGATIVE
Hepatitis B Surface Ag: NEGATIVE

## 2018-06-10 LAB — VANCOMYCIN, TROUGH: Vancomycin Tr: 13 ug/mL — ABNORMAL LOW (ref 15–20)

## 2018-06-10 LAB — VANCOMYCIN, PEAK: Vancomycin Pk: 24 ug/mL — ABNORMAL LOW (ref 30–40)

## 2018-06-10 MED ORDER — SODIUM CHLORIDE 0.9 % IV SOLN: INTRAVENOUS | Status: DC

## 2018-06-10 NOTE — Progress Notes (Signed)
PROGRESS NOTE    Todd Mancia  ZOX:[id number] DOB: 12-24-1965 DOA: 06/03/2018 PCP: Patient, No Pcp Per   Brief Narrative:  53 year old with history of tobacco use, IV drug abuse, cocaine use came to the hospital with complaints of left-sided chest pain and shortness of breath.  He was found to have left-sided partially loculated pleural effusion with multifocal infiltrates on the CT of the chest.  Underwent thoracentesis by IR followed by VATS by cardiothoracic surgery on 2/21.  Cultures ended growing MRSA.  Cardiology was consulted for TEE as recommended by infectious disease.   Assessment & Plan:   Principal Problem:   PNA (pneumonia)/Lt Sided Efusion/Empyema Active Problems:   Polysubstance abuse -- IVDU (Cocaine)   Tobacco abuse   Empyema of left pleural space (HCC)  Multifocal pneumonia with MRSA and possible Klebsiella Empyema of the left lung and abscess of the left arm due to IV drug abuse -Status post VATS 2/21 with I&D of his arm.  Cultures grew MRSA and Klebsiella.  Chest tubes were removed by cardiothoracic surgery who is also following along.  Case was discussed by the previous provider with infectious disease who recommended TEE.  As of right now TEE is planned for tomorrow morning.  N.p.o. past midnight. -Supplemental oxygen, bronchodilators PRN.  I have encouraged to use incentive spirometry -needs IV antibiotics for at least 14 days before he can be switched over to oral.  -Hep B - neg, HCV Abx elevated, will check quantitative   Polysubstance abuse -Counseled to quit using this.  Would benefit from referral program upon discharge  Active tobacco use - Nicotine patch as needed.  I would preferably continue patient on telemetry sitter at this time as there is concerns for possible suspicious activity with his family member abusing IV line to administer other drugs not prescribed in the hospital.  Nursing staff is aware of this.  If necessary we will have to restrict  and/or stop visitors.  DVT prophylaxis: SCDs Code Status: Full code Family Communication: None at bedside Disposition Plan: Should be able to transfer him to cardiac telemetry  Consultants:   Infectious disease  Cardiothoracic surgery  Cardiology for TEE   Subjective: States he feels a little better this morning.  Denies ambulating much in the hallway but have encouraged him to do so.  Review of Systems Otherwise negative except as per HPI, including: General: Denies fever, chills, night sweats or unintended weight loss. Resp: Denies cough, wheezing, shortness of breath. Cardiac: Denies chest pain, palpitations, orthopnea, paroxysmal nocturnal dyspnea. GI: Denies abdominal pain, nausea, vomiting, diarrhea or constipation GU: Denies dysuria, frequency, hesitancy or incontinence MS: Denies muscle aches, joint pain or swelling Neuro: Denies headache, neurologic deficits (focal weakness, numbness, tingling), abnormal gait Psych: Denies anxiety, depression, SI/HI/AVH Skin: Denies new rashes or lesions ID: Denies sick contacts, exotic exposures, travel  Objective: Vitals:   06/09/18 2358 06/10/18 0359 06/10/18 0807 06/10/18 1146  BP: 110/76 112/71 114/81   Pulse:   77   Resp: (!) 23 15 16    Temp: 97.9 F (36.6 C) 98 F (36.7 C) 98.8 F (37.1 C) 97.9 F (36.6 C)  TempSrc: Oral Oral Oral Oral  SpO2:   94%   Weight:      Height:        Intake/Output Summary (Last 24 hours) at 06/10/2018 1150 Last data filed at 06/10/2018 1100 Gross per 24 hour  Intake 1151.86 ml  Output 1900 ml  Net -748.14 ml   [employer]   06/03/18  1610 06/04/18 1023  Weight: 74.8 kg 74.8 kg    Examination:  General exam: Appears calm and comfortable  Respiratory system: Clear to auscultation. Respiratory effort normal. Cardiovascular system: S1 & S2 heard, RRR. No JVD, murmurs, rubs, gallops or clicks. No pedal edema. Gastrointestinal system: Abdomen is nondistended, soft and nontender. No  organomegaly or masses felt. Normal bowel sounds heard. Central nervous system: Alert and oriented. No focal neurological deficits. Extremities: Symmetric 5 x 5 power. Skin: No rashes, lesions or ulcers Psychiatry: Judgement and insight appear normal. Mood & affect appropriate.     Data Reviewed:   CBC: Recent Labs  Lab 06/04/18 0812 06/05/18 0528 06/06/18 0322 06/07/18 0305 06/08/18 0229  WBC 22.1* 13.3* 15.0* 12.7* 12.9*  NEUTROABS  --   --   --  8.5*  --   HGB 12.2* 11.0* 11.0* 11.2* 11.8*  HCT 35.5* 34.6* 33.7* 34.9* 35.5*  MCV 86.8 88.9 88.9 89.3 87.9  PLT 390 432* 441* 481* 490*   Basic Metabolic Panel: Recent Labs  Lab 06/04/18 0812 06/05/18 0528 06/06/18 0322 06/07/18 0305 06/08/18 0229  NA 132* 135 131* 134* 134*  K 4.3 4.0 3.9 3.8 4.2  CL 98 102 97* 100 100  CO2 21*  GLUCOSE 149* 112* 99 130* 106*  BUN 14 7 5* 5* 8  CREATININE 0.76 0.74 0.70 0.73 0.66  CALCIUM 7.9* 7.8* 8.0* 8.1* 8.3*   GFR: Estimated Creatinine Clearance: 104.5 mL/min (by C-G formula based on SCr of 0.66 mg/dL). Liver Function Tests: Recent Labs  Lab 06/03/18 1558 06/06/18 0322  AST  --  21  ALT  --  17  ALKPHOS  --  78  BILITOT  --  0.7  PROT 6.2* 6.0*  ALBUMIN  --  1.8*   No results for input(s): LIPASE, AMYLASE in the last 168 hours. No results for input(s): AMMONIA in the last 168 hours. Coagulation Profile: No results for input(s): INR, PROTIME in the last 168 hours. Cardiac Enzymes: No results for input(s): CKTOTAL, CKMB, CKMBINDEX, TROPONINI in the last 168 hours. BNP (last 3 results) No results for input(s): PROBNP in the last 8760 hours. HbA1C: No results for input(s): HGBA1C in the last 72 hours. CBG: No results for input(s): GLUCAP in the last 168 hours. Lipid Profile: No results for input(s): CHOL, HDL, LDLCALC, TRIG, CHOLHDL, LDLDIRECT in the last 72 hours. Thyroid Function Tests: No results for input(s): TSH, T4TOTAL, FREET4, T3FREE, THYROIDAB  in the last 72 hours. Anemia Panel: No results for input(s): VITAMINB12, FOLATE, FERRITIN, TIBC, IRON, RETICCTPCT in the last 72 hours. Sepsis Labs: No results for input(s): PROCALCITON, LATICACIDVEN in the last 168 hours.  Recent Results (from the past 240 hour(s))  Blood culture (routine x 2)     Status: None   Collection Time: 06/03/18 11:29 AM  Result Value Ref Range Status   Specimen Description RIGHT ANTECUBITAL  Final   Special Requests   Final    BOTTLES DRAWN AEROBIC AND ANAEROBIC Blood Culture adequate volume   Culture   Final    NO GROWTH 5 DAYS Performed at [facility], [address] [place], [place] [address]    Report Status 06/08/2018 FINAL  Final  Blood culture (routine x 2)     Status: None   Collection Time: 06/03/18 11:29 AM  Result Value Ref Range Status   Specimen Description BLOOD RIGHT WRIST  Final   Special Requests   Final    BOTTLES DRAWN AEROBIC AND ANAEROBIC  Blood Culture adequate volume   Culture   Final    NO GROWTH 5 DAYS Performed at [facility], [address] [place], [place] [address]    Report Status 06/08/2018 FINAL  Final  Fungus Culture With Stain     Status: None (Preliminary result)   Collection Time: 06/03/18  1:58 PM  Result Value Ref Range Status   Fungus Stain Final report  Final    Comment: (NOTE) Performed At: [facility] [address] [place], [place] [id number] Jolene Schimke MD [id number]    Fungus (Mycology) Culture PENDING  Incomplete   Fungal Source PLEURAL  Final    Comment: LEFT Performed at [facility], [address], [place], [place] [address]   Acid Fast Smear (AFB)     Status: None   Collection Time: 06/03/18  1:58 PM  Result Value Ref Range Status   AFB Specimen Processing Concentration  Final   Acid Fast Smear Negative  Final    Comment: (NOTE) Performed At: [facility] [address] [place], [place] [id number] Jolene Schimke MD [id number]    Source (AFB) PLEURAL  Final     Comment: LEFT Performed at [facility], [address], [place], [place] [address]   Gram stain     Status: None   Collection Time: 06/03/18  1:58 PM  Result Value Ref Range Status   Specimen Description PLEURAL LEFT  Final   Special Requests NONE  Final   Gram Stain   Final    CYTOSPIN SMEAR NO ORGANISMS SEEN WBC PRESENT,BOTH PMN AND MONONUCLEAR Performed at [facility], [address], [place], [place] [address]    Report Status 06/03/2018 FINAL  Final  Culture, body fluid-bottle     Status: Abnormal   Collection Time: 06/03/18  1:58 PM  Result Value Ref Range Status   Specimen Description   Final    PLEURAL LEFT Performed at [facility], [address] [place], [place] [address]    Special Requests   Final    BOTTLES DRAWN AEROBIC AND ANAEROBIC 10CC Performed at [facility], [address], [place], [place] [address]    Gram Stain   Final    GRAM POSITIVE COCCI IN CLUSTERS WBC PRESENT, PREDOMINANTLY PMN BOTH ANA AND AEB BOTTLES Gram Stain Report Called to,Read Back By and Verified With: GRAHAM,A AT 0655 ON 06/04/18 BY HUFFINES,S. Performed at [facility], [address] [place], [place] [address]    Culture METHICILLIN RESISTANT STAPHYLOCOCCUS AUREUS (A)  Final   Report Status 06/06/2018 FINAL  Final   Organism ID, Bacteria METHICILLIN RESISTANT STAPHYLOCOCCUS AUREUS  Final      Susceptibility   Methicillin resistant staphylococcus aureus - MIC*    CIPROFLOXACIN >=8 RESISTANT Resistant     ERYTHROMYCIN >=8 RESISTANT Resistant     GENTAMICIN <=0.5 SENSITIVE Sensitive     OXACILLIN >=4 RESISTANT Resistant     TETRACYCLINE <=1 SENSITIVE Sensitive     VANCOMYCIN <=0.5 SENSITIVE Sensitive     TRIMETH/SULFA <=10 SENSITIVE Sensitive     CLINDAMYCIN >=8 RESISTANT Resistant     RIFAMPIN <=0.5 SENSITIVE Sensitive     Inducible Clindamycin NEGATIVE Sensitive     * METHICILLIN RESISTANT STAPHYLOCOCCUS AUREUS  Fungus Culture Result     Status: None   Collection Time:  06/03/18  1:58 PM  Result Value Ref Range Status   Result 1 Comment  Final    Comment: (NOTE) KOH/Calcofluor preparation:  no fungus observed. Performed At: [facility]  [address] [place], [place] [id number] Jolene Schimke MD WU:[id number]   Aerobic Culture (superficial specimen)     Status: None   Collection Time: 06/03/18  5:23 PM  Result Value Ref Range Status   Specimen Description   Final    ABSCESS Performed at [facility], [address], [place], [place] [address]    Special Requests   Final    NONE Performed at [facility], [address] [place], [place] [address]    Gram Stain   Final    ABUNDANT WBC PRESENT, PREDOMINANTLY PMN RARE GRAM POSITIVE COCCI Performed at [facility], [address], [place], [place] [address]    Culture   Final    ABUNDANT METHICILLIN RESISTANT STAPHYLOCOCCUS AUREUS FEW KLEBSIELLA PNEUMONIAE    Report Status 06/06/2018 FINAL  Final   Organism ID, Bacteria KLEBSIELLA PNEUMONIAE  Final   Organism ID, Bacteria METHICILLIN RESISTANT STAPHYLOCOCCUS AUREUS  Final      Susceptibility   Klebsiella pneumoniae - MIC*    AMPICILLIN >=32 RESISTANT Resistant     CEFAZOLIN <=4 SENSITIVE Sensitive     CEFEPIME <=1 SENSITIVE Sensitive     CEFTAZIDIME <=1 SENSITIVE Sensitive     CEFTRIAXONE <=1 SENSITIVE Sensitive     CIPROFLOXACIN <=0.25 SENSITIVE Sensitive     GENTAMICIN <=1 SENSITIVE Sensitive     IMIPENEM <=0.25 SENSITIVE Sensitive     TRIMETH/SULFA <=20 SENSITIVE Sensitive     AMPICILLIN/SULBACTAM 4 SENSITIVE Sensitive     PIP/TAZO <=4 SENSITIVE Sensitive     Extended ESBL NEGATIVE Sensitive     * FEW KLEBSIELLA PNEUMONIAE   Methicillin resistant staphylococcus aureus - MIC*    CIPROFLOXACIN >=8 RESISTANT Resistant     ERYTHROMYCIN >=8 RESISTANT Resistant     GENTAMICIN <=0.5 SENSITIVE Sensitive     OXACILLIN >=4 RESISTANT Resistant     TETRACYCLINE <=1 SENSITIVE Sensitive     VANCOMYCIN 1 SENSITIVE Sensitive      TRIMETH/SULFA <=10 SENSITIVE Sensitive     CLINDAMYCIN >=8 RESISTANT Resistant     RIFAMPIN <=0.5 SENSITIVE Sensitive     Inducible Clindamycin NEGATIVE Sensitive     * ABUNDANT METHICILLIN RESISTANT STAPHYLOCOCCUS AUREUS  MRSA PCR Screening     Status: None   Collection Time: 06/04/18 10:01 AM  Result Value Ref Range Status   MRSA by PCR NEGATIVE NEGATIVE Final    Comment:        The GeneXpert MRSA Assay (FDA approved for NASAL specimens only), is one component of a comprehensive MRSA colonization surveillance program. It is not intended to diagnose MRSA infection nor to guide or monitor treatment for MRSA infections. Performed at [facility], [address], [place], [place] [address]   Aerobic Culture (superficial specimen)     Status: None   Collection Time: 06/04/18 11:51 AM  Result Value Ref Range Status   Specimen Description WOUND  Final   Special Requests NONE  Final   Gram Stain   Final    FEW WBC PRESENT,BOTH PMN AND MONONUCLEAR NO ORGANISMS SEEN RESULT CALLED TO, READ BACK BY AND VERIFIED WITH: Wynetta Fines MD 12:35 06/04/18 ([email or web address]) Performed at [facility], [address] [place], [place] [address]    Culture   Final    FEW METHICILLIN RESISTANT STAPHYLOCOCCUS AUREUS RARE KLEBSIELLA PNEUMONIAE    Report Status 06/06/2018 FINAL  Final   Organism ID, Bacteria KLEBSIELLA PNEUMONIAE  Final   Organism ID, Bacteria METHICILLIN RESISTANT STAPHYLOCOCCUS AUREUS  Final  Susceptibility   Klebsiella pneumoniae - MIC*    AMPICILLIN >=32 RESISTANT Resistant     CEFAZOLIN <=4 SENSITIVE Sensitive     CEFEPIME <=1 SENSITIVE Sensitive     CEFTAZIDIME <=1 SENSITIVE Sensitive     CEFTRIAXONE <=1 SENSITIVE Sensitive     CIPROFLOXACIN <=0.25 SENSITIVE Sensitive     GENTAMICIN <=1 SENSITIVE Sensitive     IMIPENEM <=0.25 SENSITIVE Sensitive     TRIMETH/SULFA <=20 SENSITIVE Sensitive     AMPICILLIN/SULBACTAM 8 SENSITIVE Sensitive     PIP/TAZO <=4 SENSITIVE  Sensitive     Extended ESBL NEGATIVE Sensitive     * RARE KLEBSIELLA PNEUMONIAE   Methicillin resistant staphylococcus aureus - MIC*    CIPROFLOXACIN >=8 RESISTANT Resistant     ERYTHROMYCIN >=8 RESISTANT Resistant     GENTAMICIN <=0.5 SENSITIVE Sensitive     OXACILLIN RESISTANT Resistant     TETRACYCLINE <=1 SENSITIVE Sensitive     VANCOMYCIN <=0.5 SENSITIVE Sensitive     TRIMETH/SULFA <=10 SENSITIVE Sensitive     CLINDAMYCIN >=8 RESISTANT Resistant     RIFAMPIN <=0.5 SENSITIVE Sensitive     Inducible Clindamycin NEGATIVE Sensitive     * FEW METHICILLIN RESISTANT STAPHYLOCOCCUS AUREUS  Culture, respiratory     Status: None   Collection Time: 06/04/18 12:02 PM  Result Value Ref Range Status   Specimen Description BRONCHIAL WASHINGS  Final   Special Requests NONE  Final   Gram Stain   Final    RARE WBC PRESENT, PREDOMINANTLY PMN FEW SQUAMOUS EPITHELIAL CELLS PRESENT FEW GRAM POSITIVE RODS RARE GRAM POSITIVE COCCI RARE GRAM NEGATIVE RODS    Culture   Final    FEW Consistent with normal respiratory flora. Performed at [facility], [address], [place], [place] [address]    Report Status 06/06/2018 FINAL  Final  Acid Fast Smear (AFB)     Status: None   Collection Time: 06/04/18 12:02 PM  Result Value Ref Range Status   AFB Specimen Processing Concentration  Final   Acid Fast Smear Negative  Final    Comment: (NOTE) Performed At: [facility] [address] [place], [place] [id number] Jolene Schimke MD YI:[id number]    Source (AFB) PLEURAL  Final  Body fluid culture     Status: None   Collection Time: 06/04/18 12:31 PM  Result Value Ref Range Status   Specimen Description PLEURAL  Final   Special Requests LEFT  Final   Gram Stain   Final    FEW WBC PRESENT,BOTH PMN AND MONONUCLEAR MODERATE GRAM POSITIVE COCCI Performed at [facility], [address], [place], [place] [address]    Culture FEW METHICILLIN RESISTANT STAPHYLOCOCCUS AUREUS  Final    Report Status 06/06/2018 FINAL  Final   Organism ID, Bacteria METHICILLIN RESISTANT STAPHYLOCOCCUS AUREUS  Final      Susceptibility   Methicillin resistant staphylococcus aureus - MIC*    CIPROFLOXACIN >=8 RESISTANT Resistant     ERYTHROMYCIN >=8 RESISTANT Resistant     GENTAMICIN <=0.5 SENSITIVE Sensitive     OXACILLIN >=4 RESISTANT Resistant     TETRACYCLINE <=1 SENSITIVE Sensitive     VANCOMYCIN <=0.5 SENSITIVE Sensitive     TRIMETH/SULFA <=10 SENSITIVE Sensitive     CLINDAMYCIN >=8 RESISTANT Resistant     RIFAMPIN <=0.5 SENSITIVE Sensitive     Inducible Clindamycin NEGATIVE Sensitive     * FEW METHICILLIN RESISTANT STAPHYLOCOCCUS AUREUS  Aerobic/Anaerobic Culture (surgical/deep wound)     Status: None   Collection  Time: 06/04/18 12:31 PM  Result Value Ref Range Status   Specimen Description PLEURAL  Final   Special Requests NONE  Final   Gram Stain   Final    ABUNDANT WBC PRESENT,BOTH PMN AND MONONUCLEAR RARE GRAM POSITIVE COCCI    Culture   Final    MODERATE METHICILLIN RESISTANT STAPHYLOCOCCUS AUREUS NO ANAEROBES ISOLATED Performed at [facility], [address], [place], [place] [address]    Report Status 06/09/2018 FINAL  Final   Organism ID, Bacteria METHICILLIN RESISTANT STAPHYLOCOCCUS AUREUS  Final      Susceptibility   Methicillin resistant staphylococcus aureus - MIC*    CIPROFLOXACIN >=8 RESISTANT Resistant     ERYTHROMYCIN >=8 RESISTANT Resistant     GENTAMICIN <=0.5 SENSITIVE Sensitive     OXACILLIN RESISTANT Resistant     TETRACYCLINE <=1 SENSITIVE Sensitive     VANCOMYCIN <=0.5 SENSITIVE Sensitive     TRIMETH/SULFA <=10 SENSITIVE Sensitive     CLINDAMYCIN >=8 RESISTANT Resistant     RIFAMPIN <=0.5 SENSITIVE Sensitive     Inducible Clindamycin NEGATIVE Sensitive     * MODERATE METHICILLIN RESISTANT STAPHYLOCOCCUS AUREUS  Acid Fast Smear (AFB)     Status: None   Collection Time: 06/04/18 12:31 PM  Result Value Ref Range Status   AFB Specimen  Processing Concentration  Final   Acid Fast Smear Negative  Final    Comment: (NOTE) Performed At: [facility] [address] [place], [place] [id number] Jolene Schimke MD XB:[id number]    Source (AFB) BRONCHIAL WASHINGS  Final  Acid Fast Smear (AFB)     Status: None   Collection Time: 06/04/18 12:35 PM  Result Value Ref Range Status   AFB Specimen Processing Concentration  Final   Acid Fast Smear Negative  Final    Comment: (NOTE) Performed At: [facility] [address] [place], [place] [id number] Jolene Schimke MD [id number]    Source (AFB) PLEURAL  Final  Culture, blood (routine x 2)     Status: None (Preliminary result)   Collection Time: 06/09/18  6:25 AM  Result Value Ref Range Status   Specimen Description BLOOD LEFT HAND  Final   Special Requests   Final    BOTTLES DRAWN AEROBIC ONLY Blood Culture adequate volume   Culture PENDING  Incomplete   Report Status PENDING  Incomplete  Culture, blood (routine x 2)     Status: None (Preliminary result)   Collection Time: 06/09/18  6:33 AM  Result Value Ref Range Status   Specimen Description BLOOD LEFT HAND  Final   Special Requests   Final    BOTTLES DRAWN AEROBIC ONLY Blood Culture adequate volume   Culture PENDING  Incomplete   Report Status PENDING  Incomplete         Radiology Studies: Dg Chest 2 View  Result Date: 06/09/2018 CLINICAL DATA:  Left-sided chest pain status post chest tube removal. EXAM: CHEST - 2 VIEW COMPARISON:  Radiographs of June 08, 2018. FINDINGS: The heart size and mediastinal contours are within normal limits. No pneumothorax is noted. Stable minimal scarring or atelectasis seen in right lung base. Stable left basilar atelectasis or scarring is noted with associated pleural thickening or small pleural effusion. The visualized skeletal structures are unremarkable. IMPRESSION: Stable bilateral lung findings as described above. Electronically Signed   By: Lupita Raider,  M.D.   On: 06/09/2018 10:33   Dg Chest 2 View  Result Date: 06/08/2018 CLINICAL DATA:  Left chest tube removal  EXAM: CHEST - 2 VIEW COMPARISON:  06/08/2018 FINDINGS: Interval removal of left chest tube. No visible pneumothorax. Left pleural effusion or pleural thickening laterally again noted, stable. Left lower lobe opacity is stable. Linear atelectasis or scarring at the right base. Heart is normal size. IMPRESSION: Interval removal of left chest tube without visible pneumothorax. Otherwise no change. Electronically Signed   By: Charlett NoseKevin  Dover M.D.   On: 06/08/2018 17:33        Scheduled Meds: . bisacodyl  10 mg Oral Daily  . gabapentin  300 mg Oral TID  . guaiFENesin  600 mg Oral BID  . hydrOXYzine  25 mg Oral TID  . hydrOXYzine  50 mg Oral QHS  . methocarbamol  750 mg Oral Q6H  . multivitamin with minerals  1 tablet Oral Daily  . nicotine  21 mg Transdermal Daily  . polyethylene glycol  17 g Oral Daily  . senna  2 tablet Oral QHS  . senna-docusate  1 tablet Oral QHS  . sodium chloride flush  10-40 mL Intracatheter Q12H   Continuous Infusions: . sodium chloride 50 mL/hr at 06/10/18 0400  . potassium chloride    . vancomycin 1,000 mg (06/10/18 0849)     LOS: 7 days   Time spent= 35 mins    Lanna Labella Joline Maxcyhirag Ellington [place], MD [facility]  If 7PM-7AM, please contact night-coverage [email or web address] 06/10/2018, 11:50 AM

## 2018-06-10 NOTE — Progress Notes (Signed)
Pharmacy Antibiotic Note  Todd Hooper is a 53 y.o. male admitted on 06/03/2018 with sepsis/lung abscess. Pharmacy has been consulted for vanc dosing. ID following and plans for IV therapy for 10-14 days then oral antibiotics -WBC= 12.9, afebrile, SCr= 0.66 -vancomycin peak= 24, trough= 13; steady state AUC= 465   Plan: Continue vancomycin  1000 mg every 8 hours. Zosyn 3.375g IV every 8 hours. Monitor labs, c/s, and vanco levels as indicated.  Height: 5\' 8"  (172.7 cm) Weight: 164 lb 14.5 oz (74.8 kg) IBW/kg (Calculated) : 68.4  Temp (24hrs), Avg:98.2 F (36.8 C), Min:97.9 F (36.6 C), Max:98.8 F (37.1 C)  Recent Labs  Lab 06/04/18 0812 06/05/18 0528 06/06/18 0322 06/07/18 0305 06/08/18 0229 06/10/18 1133 06/10/18 1539  WBC 22.1* 13.3* 15.0* 12.7* 12.9*  --   --   CREATININE 0.76 0.74 0.70 0.73 0.66  --   --   VANCOTROUGH  --   --   --  6*  --   --  13*  VANCOPEAK  --   --   --   --   --  24*  --     Estimated Creatinine Clearance: 104.5 mL/min (by C-G formula based on SCr of 0.66 mg/dL).    No Known Allergies  Antimicrobials this admission: Zosyn 2/20 >>  Vanco 2/20 >>   Dose adjustments this admission: 2/24 VT: 6 (12 hours after last dose) >> change to 1 g IV q8 hr 2/27: vancomycin peak= 24, trough= 13  Microbiology results: 2/20 pleural fluid MRSA 2/20 BCx x 2: neg 2/20 wound: MRSA, Kleb (R amp) 2/21 bronchial wash (OR): few GPR, rare GPC, rare GNR >> norm flora 2/21 pleural fluid (OR): MRSA 2/21 wound: MRSA, Kleb (R amp)ora 2/6 blood x2 - ngtd  Thank you for allowing pharmacy to be a part of this patient's care.  Harland German, PharmD Clinical Pharmacist **Pharmacist phone directory can now be found on [email or web address] ([email or web address]).  Listed under [facility].    06/10/2018 5:38 PM

## 2018-06-10 NOTE — Progress Notes (Addendum)
[address]       [employer] [address]             [phone]      6 Days Post-Op Procedure(s) (LRB): VIDEO BRONCHOSCOPY (N/A) Packing of Left Upper Arm Wound (Left) Left Video Assisted Thoracoscopy (Vats)/Decortication and Drainage of Empyema (Left) Subjective: Feels pretty well, mild pain , doing well with IS, no significant SOB  Objective: Vital signs in last 24 hours: Temp:  [97.6 F (36.4 C)-99 F (37.2 C)] 98.8 F (37.1 C) (02/27 0807) Pulse Rate:  [77-86] 77 (02/27 0807) Cardiac Rhythm: Normal sinus rhythm (02/27 0400) Resp:  [15-23] 16 (02/27 0807) BP: (110-125)/(71-95) 114/81 (02/27 0807) SpO2:  [93 %-94 %] 94 % (02/27 0807)  Hemodynamic parameters for last 24 hours:    Intake/Output from previous day: 02/26 0701 - 02/27 0700 In: 1501.9 [I.V.:800.1; IV Piggyback:701.7] Out: 1320 [Urine:1320] Intake/Output this shift: Total I/O In: -  Out: 600 [Urine:600]  General appearance: alert, cooperative and no distress Heart: regular rate and rhythm, no rub and no murmur Lungs: mildly dim left base Abdomen: benign Extremities: no edema or calf tenderness Wound: incis healing well without signs of infection  Lab Results: Recent Labs    06/08/18 0229  WBC 12.9*  HGB 11.8*  HCT 35.5*  PLT 490*   BMET:  Recent Labs    06/08/18 0229  NA 134*  K 4.2  CL 100  CO2 21*  GLUCOSE 106*  BUN 8  CREATININE 0.66  CALCIUM 8.3*    PT/INR: No results for input(s): LABPROT, INR in the last 72 hours. ABG    Component Value Date/Time   PHART 7.467 (H) 06/05/2018 0450   HCO3 26.6 06/05/2018 0450   O2SAT 93.6 06/05/2018 0450   CBG (last 3)  No results for input(s): GLUCAP in the last 72 hours.  Meds Scheduled Meds: . bisacodyl  10 mg Oral Daily  . gabapentin  300 mg Oral TID  . guaiFENesin  600 mg Oral BID  . hydrOXYzine  25 mg Oral TID  . hydrOXYzine  50 mg Oral QHS  . methocarbamol  750 mg Oral Q6H  . multivitamin with minerals   1 tablet Oral Daily  . nicotine  21 mg Transdermal Daily  . polyethylene glycol  17 g Oral Daily  . senna  2 tablet Oral QHS  . senna-docusate  1 tablet Oral QHS  . sodium chloride flush  10-40 mL Intracatheter Q12H   Continuous Infusions: . sodium chloride 50 mL/hr at 06/10/18 0400  . potassium chloride    . vancomycin 1,000 mg (06/10/18 0005)   PRN Meds:.acetaminophen, guaiFENesin-codeine, ipratropium-albuterol, ondansetron **OR** [DISCONTINUED] ondansetron (ZOFRAN) IV, oxyCODONE, potassium chloride, traMADol, traZODone  Xrays Dg Chest 2 View  Result Date: 06/09/2018 CLINICAL DATA:  Left-sided chest pain status post chest tube removal. EXAM: CHEST - 2 VIEW COMPARISON:  Radiographs of June 08, 2018. FINDINGS: The heart size and mediastinal contours are within normal limits. No pneumothorax is noted. Stable minimal scarring or atelectasis seen in right lung base. Stable left basilar atelectasis or scarring is noted with associated pleural thickening or small pleural effusion. The visualized skeletal structures are unremarkable. IMPRESSION: Stable bilateral lung findings as described above. Electronically Signed   By: Lupita Raider, M.D.   On: 06/09/2018 10:33   Dg Chest 2 View  Result Date: 06/08/2018 CLINICAL DATA:  Left chest tube removal EXAM: CHEST - 2 VIEW COMPARISON:  06/08/2018 FINDINGS: Interval removal  of left chest tube. No visible pneumothorax. Left pleural effusion or pleural thickening laterally again noted, stable. Left lower lobe opacity is stable. Linear atelectasis or scarring at the right base. Heart is normal size. IMPRESSION: Interval removal of left chest tube without visible pneumothorax. Otherwise no change. Electronically Signed   By: Charlett NoseKevin  Dover M.D.   On: 06/08/2018 17:33   Results for orders placed or performed during the hospital encounter of 06/03/18  Blood culture (routine x 2)     Status: None   Collection Time: 06/03/18 11:29 AM  Result Value Ref Range  Status   Specimen Description RIGHT ANTECUBITAL  Final   Special Requests   Final    BOTTLES DRAWN AEROBIC AND ANAEROBIC Blood Culture adequate volume   Culture   Final    NO GROWTH 5 DAYS Performed at [facility], [address] [place], [place] [address]    Report Status 06/08/2018 FINAL  Final  Blood culture (routine x 2)     Status: None   Collection Time: 06/03/18 11:29 AM  Result Value Ref Range Status   Specimen Description BLOOD RIGHT WRIST  Final   Special Requests   Final    BOTTLES DRAWN AEROBIC AND ANAEROBIC Blood Culture adequate volume   Culture   Final    NO GROWTH 5 DAYS Performed at [facility], [address] [place], [place] [address]    Report Status 06/08/2018 FINAL  Final  Fungus Culture With Stain     Status: None (Preliminary result)   Collection Time: 06/03/18  1:58 PM  Result Value Ref Range Status   Fungus Stain Final report  Final    Comment: (NOTE) Performed At: [facility] [address] [place], [place] [id number] Jolene SchimkeNagendra Sanjai MD NU:[id number]:[phone]    Fungus (Mycology) Culture PENDING  Incomplete   Fungal Source PLEURAL  Final    Comment: LEFT Performed at [facility], [address] [place], [place] [address]   Acid Fast Smear (AFB)     Status: None   Collection Time: 06/03/18  1:58 PM  Result Value Ref Range Status   AFB Specimen Processing Concentration  Final   Acid Fast Smear Negative  Final    Comment: (NOTE) Performed At: [facility] [address] [place], [place] [id number] Jolene SchimkeNagendra Sanjai MD EP:[id number]:[phone]    Source (AFB) PLEURAL  Final    Comment: LEFT Performed at [facility], [address] [place], [place] [address]   Gram stain     Status: None   Collection Time: 06/03/18  1:58 PM  Result Value Ref Range Status   Specimen Description PLEURAL LEFT  Final   Special Requests NONE  Final   Gram Stain   Final    CYTOSPIN SMEAR NO ORGANISMS SEEN WBC PRESENT,BOTH PMN AND MONONUCLEAR Performed at [facility], [address] [place], [place] [address]    Report Status 06/03/2018 FINAL  Final  Culture, body fluid-bottle     Status: Abnormal   Collection Time: 06/03/18  1:58 PM  Result Value Ref Range Status   Specimen Description   Final    PLEURAL LEFT Performed at [facility], [address] [place], [place] [address]    Special Requests   Final    BOTTLES DRAWN AEROBIC AND ANAEROBIC 10CC Performed at [facility], [address] [place], [place] [address]    Gram Stain   Final    GRAM POSITIVE COCCI IN CLUSTERS WBC PRESENT, PREDOMINANTLY PMN BOTH ANA AND AEB  BOTTLES Gram Stain Report Called to,Read Back By and Verified With: GRAHAM,A AT 0655 ON 06/04/18 BY HUFFINES,S. Performed at [facility], [address] [place], [place] [address]    Culture METHICILLIN RESISTANT STAPHYLOCOCCUS AUREUS (A)  Final   Report Status 06/06/2018 FINAL  Final   Organism ID, Bacteria METHICILLIN RESISTANT STAPHYLOCOCCUS AUREUS  Final      Susceptibility   Methicillin resistant staphylococcus aureus - MIC*    CIPROFLOXACIN >=8 RESISTANT Resistant     ERYTHROMYCIN >=8 RESISTANT Resistant     GENTAMICIN <=0.5 SENSITIVE Sensitive     OXACILLIN >=4 RESISTANT Resistant     TETRACYCLINE <=1 SENSITIVE Sensitive     VANCOMYCIN <=0.5 SENSITIVE Sensitive     TRIMETH/SULFA <=10 SENSITIVE Sensitive     CLINDAMYCIN >=8 RESISTANT Resistant     RIFAMPIN <=0.5 SENSITIVE Sensitive     Inducible Clindamycin NEGATIVE Sensitive     * METHICILLIN RESISTANT STAPHYLOCOCCUS AUREUS  Fungus Culture Result     Status: None   Collection Time: 06/03/18  1:58 PM  Result Value Ref Range Status   Result 1 Comment  Final    Comment: (NOTE) KOH/Calcofluor preparation:  no fungus observed. Performed At: [facility] [address] [place], [place] [address] Jolene Schimke MD WU:[id number]   Aerobic Culture (superficial specimen)     Status: None   Collection Time: 06/03/18  5:23 PM  Result Value Ref  Range Status   Specimen Description   Final    ABSCESS Performed at [facility], [address] [place], [place] [address]    Special Requests   Final    NONE Performed at [facility], [address] [place], [place] [address]    Gram Stain   Final    ABUNDANT WBC PRESENT, PREDOMINANTLY PMN RARE GRAM POSITIVE COCCI Performed at [facility], [address] [place], [place] [address]    Culture   Final    ABUNDANT METHICILLIN RESISTANT STAPHYLOCOCCUS AUREUS FEW KLEBSIELLA PNEUMONIAE    Report Status 06/06/2018 FINAL  Final   Organism ID, Bacteria KLEBSIELLA PNEUMONIAE  Final   Organism ID, Bacteria METHICILLIN RESISTANT STAPHYLOCOCCUS AUREUS  Final      Susceptibility   Klebsiella pneumoniae - MIC*    AMPICILLIN >=32 RESISTANT Resistant     CEFAZOLIN <=4 SENSITIVE Sensitive     CEFEPIME <=1 SENSITIVE Sensitive     CEFTAZIDIME <=1 SENSITIVE Sensitive     CEFTRIAXONE <=1 SENSITIVE Sensitive     CIPROFLOXACIN <=0.25 SENSITIVE Sensitive     GENTAMICIN <=1 SENSITIVE Sensitive     IMIPENEM <=0.25 SENSITIVE Sensitive     TRIMETH/SULFA <=20 SENSITIVE Sensitive     AMPICILLIN/SULBACTAM 4 SENSITIVE Sensitive     PIP/TAZO <=4 SENSITIVE Sensitive     Extended ESBL NEGATIVE Sensitive     * FEW KLEBSIELLA PNEUMONIAE   Methicillin resistant staphylococcus aureus - MIC*    CIPROFLOXACIN >=8 RESISTANT Resistant     ERYTHROMYCIN >=8 RESISTANT Resistant     GENTAMICIN <=0.5 SENSITIVE Sensitive     OXACILLIN >=4 RESISTANT Resistant     TETRACYCLINE <=1 SENSITIVE Sensitive     VANCOMYCIN 1 SENSITIVE Sensitive     TRIMETH/SULFA <=10 SENSITIVE Sensitive     CLINDAMYCIN >=8 RESISTANT Resistant     RIFAMPIN <=0.5 SENSITIVE Sensitive     Inducible Clindamycin NEGATIVE Sensitive     * ABUNDANT METHICILLIN RESISTANT STAPHYLOCOCCUS AUREUS  MRSA PCR Screening     Status: None   Collection Time: 06/04/18 10:01  AM  Result Value Ref Range Status   MRSA by PCR NEGATIVE NEGATIVE Final     Comment:        The GeneXpert MRSA Assay (FDA approved for NASAL specimens only), is one component of a comprehensive MRSA colonization surveillance program. It is not intended to diagnose MRSA infection nor to guide or monitor treatment for MRSA infections. Performed at [facility], [address], [place], [place] [address]   Aerobic Culture (superficial specimen)     Status: None   Collection Time: 06/04/18 11:51 AM  Result Value Ref Range Status   Specimen Description WOUND  Final   Special Requests NONE  Final   Gram Stain   Final    FEW WBC PRESENT,BOTH PMN AND MONONUCLEAR NO ORGANISMS SEEN RESULT CALLED TO, READ BACK BY AND VERIFIED WITH: Wynetta Fines MD 12:35 06/04/18 ([email or web address]) Performed at [facility], [address], [place], [place] [address]    Culture   Final    FEW METHICILLIN RESISTANT STAPHYLOCOCCUS AUREUS RARE KLEBSIELLA PNEUMONIAE    Report Status 06/06/2018 FINAL  Final   Organism ID, Bacteria KLEBSIELLA PNEUMONIAE  Final   Organism ID, Bacteria METHICILLIN RESISTANT STAPHYLOCOCCUS AUREUS  Final      Susceptibility   Klebsiella pneumoniae - MIC*    AMPICILLIN >=32 RESISTANT Resistant     CEFAZOLIN <=4 SENSITIVE Sensitive     CEFEPIME <=1 SENSITIVE Sensitive     CEFTAZIDIME <=1 SENSITIVE Sensitive     CEFTRIAXONE <=1 SENSITIVE Sensitive     CIPROFLOXACIN <=0.25 SENSITIVE Sensitive     GENTAMICIN <=1 SENSITIVE Sensitive     IMIPENEM <=0.25 SENSITIVE Sensitive     TRIMETH/SULFA <=20 SENSITIVE Sensitive     AMPICILLIN/SULBACTAM 8 SENSITIVE Sensitive     PIP/TAZO <=4 SENSITIVE Sensitive     Extended ESBL NEGATIVE Sensitive     * RARE KLEBSIELLA PNEUMONIAE   Methicillin resistant staphylococcus aureus - MIC*    CIPROFLOXACIN >=8 RESISTANT Resistant     ERYTHROMYCIN >=8 RESISTANT Resistant     GENTAMICIN <=0.5 SENSITIVE Sensitive     OXACILLIN RESISTANT Resistant     TETRACYCLINE <=1 SENSITIVE Sensitive     VANCOMYCIN <=0.5 SENSITIVE  Sensitive     TRIMETH/SULFA <=10 SENSITIVE Sensitive     CLINDAMYCIN >=8 RESISTANT Resistant     RIFAMPIN <=0.5 SENSITIVE Sensitive     Inducible Clindamycin NEGATIVE Sensitive     * FEW METHICILLIN RESISTANT STAPHYLOCOCCUS AUREUS  Culture, respiratory     Status: None   Collection Time: 06/04/18 12:02 PM  Result Value Ref Range Status   Specimen Description BRONCHIAL WASHINGS  Final   Special Requests NONE  Final   Gram Stain   Final    RARE WBC PRESENT, PREDOMINANTLY PMN FEW SQUAMOUS EPITHELIAL CELLS PRESENT FEW GRAM POSITIVE RODS RARE GRAM POSITIVE COCCI RARE GRAM NEGATIVE RODS    Culture   Final    FEW Consistent with normal respiratory flora. Performed at [facility], [address], [place], [place] [address]    Report Status 06/06/2018 FINAL  Final  Acid Fast Smear (AFB)     Status: None   Collection Time: 06/04/18 12:02 PM  Result Value Ref Range Status   AFB Specimen Processing Concentration  Final   Acid Fast Smear Negative  Final    Comment: (NOTE) Performed At: [facility] [address] [place], [place] [id number] Jolene Schimke MD EV:[id number]    Source (AFB) PLEURAL  Final  Body fluid  culture     Status: None   Collection Time: 06/04/18 12:31 PM  Result Value Ref Range Status   Specimen Description PLEURAL  Final   Special Requests LEFT  Final   Gram Stain   Final    FEW WBC PRESENT,BOTH PMN AND MONONUCLEAR MODERATE GRAM POSITIVE COCCI Performed at [facility], [address] [place], [place] [address]    Culture FEW METHICILLIN RESISTANT STAPHYLOCOCCUS AUREUS  Final   Report Status 06/06/2018 FINAL  Final   Organism ID, Bacteria METHICILLIN RESISTANT STAPHYLOCOCCUS AUREUS  Final      Susceptibility   Methicillin resistant staphylococcus aureus - MIC*    CIPROFLOXACIN >=8 RESISTANT Resistant     ERYTHROMYCIN >=8 RESISTANT Resistant     GENTAMICIN <=0.5 SENSITIVE Sensitive     OXACILLIN >=4 RESISTANT Resistant      TETRACYCLINE <=1 SENSITIVE Sensitive     VANCOMYCIN <=0.5 SENSITIVE Sensitive     TRIMETH/SULFA <=10 SENSITIVE Sensitive     CLINDAMYCIN >=8 RESISTANT Resistant     RIFAMPIN <=0.5 SENSITIVE Sensitive     Inducible Clindamycin NEGATIVE Sensitive     * FEW METHICILLIN RESISTANT STAPHYLOCOCCUS AUREUS  Aerobic/Anaerobic Culture (surgical/deep wound)     Status: None   Collection Time: 06/04/18 12:31 PM  Result Value Ref Range Status   Specimen Description PLEURAL  Final   Special Requests NONE  Final   Gram Stain   Final    ABUNDANT WBC PRESENT,BOTH PMN AND MONONUCLEAR RARE GRAM POSITIVE COCCI    Culture   Final    MODERATE METHICILLIN RESISTANT STAPHYLOCOCCUS AUREUS NO ANAEROBES ISOLATED Performed at [facility], [address] [place], [place] [address]    Report Status 06/09/2018 FINAL  Final   Organism ID, Bacteria METHICILLIN RESISTANT STAPHYLOCOCCUS AUREUS  Final      Susceptibility   Methicillin resistant staphylococcus aureus - MIC*    CIPROFLOXACIN >=8 RESISTANT Resistant     ERYTHROMYCIN >=8 RESISTANT Resistant     GENTAMICIN <=0.5 SENSITIVE Sensitive     OXACILLIN RESISTANT Resistant     TETRACYCLINE <=1 SENSITIVE Sensitive     VANCOMYCIN <=0.5 SENSITIVE Sensitive     TRIMETH/SULFA <=10 SENSITIVE Sensitive     CLINDAMYCIN >=8 RESISTANT Resistant     RIFAMPIN <=0.5 SENSITIVE Sensitive     Inducible Clindamycin NEGATIVE Sensitive     * MODERATE METHICILLIN RESISTANT STAPHYLOCOCCUS AUREUS  Acid Fast Smear (AFB)     Status: None   Collection Time: 06/04/18 12:31 PM  Result Value Ref Range Status   AFB Specimen Processing Concentration  Final   Acid Fast Smear Negative  Final    Comment: (NOTE) Performed At: [facility] [address] [place], [place] [id number] Jolene Schimke MD JO:[id number]    Source (AFB) BRONCHIAL WASHINGS  Final  Acid Fast Smear (AFB)     Status: None   Collection Time: 06/04/18 12:35 PM  Result Value Ref Range Status   AFB  Specimen Processing Concentration  Final   Acid Fast Smear Negative  Final    Comment: (NOTE) Performed At: [facility] [address] [place], [place] [id number] Jolene Schimke MD [id number]    Source (AFB) PLEURAL  Final  Culture, blood (routine x 2)     Status: None (Preliminary result)   Collection Time: 06/09/18  6:25 AM  Result Value Ref Range Status   Specimen Description BLOOD LEFT HAND  Final   Special Requests   Final    BOTTLES DRAWN AEROBIC  ONLY Blood Culture adequate volume   Culture PENDING  Incomplete   Report Status PENDING  Incomplete  Culture, blood (routine x 2)     Status: None (Preliminary result)   Collection Time: 06/09/18  6:33 AM  Result Value Ref Range Status   Specimen Description BLOOD LEFT HAND  Final   Special Requests   Final    BOTTLES DRAWN AEROBIC ONLY Blood Culture adequate volume   Culture PENDING  Incomplete   Report Status PENDING  Incomplete   Assessment/Plan: S/P Procedure(s) (LRB): VIDEO BRONCHOSCOPY (N/A) Packing of Left Upper Arm Wound (Left) Left Video Assisted Thoracoscopy (Vats)/Decortication and Drainage of Empyema (Left)  1 doing well overall, hemodynamics are stable in sinus rhythm, sats good on RA 2 conts ABX as outlined per ID recs 3 plan for TEE tomorrow 4 significant social issues will limit d/c options  5 no new labs or CXR today   LOS: 7 days    Rowe Clack PA-C 06/10/2018 Pager [phone]  I have seen and examined [employer] and agree with the above assessment  and plan.  Delight Ovens MD Beeper [phone] Office [phone] 06/10/2018 10:19 AM

## 2018-06-11 ENCOUNTER — Encounter (HOSPITAL_COMMUNITY)
Admission: EM | Discharge status: Against Medical Advice | Payer: Self-pay | Source: Home / Self Care | Attending: Internal Medicine

## 2018-06-11 ENCOUNTER — Encounter (HOSPITAL_COMMUNITY): Payer: Self-pay | Admitting: *Deleted

## 2018-06-11 ENCOUNTER — Inpatient Hospital Stay (HOSPITAL_COMMUNITY): Payer: Self-pay | Admitting: Anesthesiology

## 2018-06-11 ENCOUNTER — Inpatient Hospital Stay (HOSPITAL_COMMUNITY): Payer: Self-pay

## 2018-06-11 DIAGNOSIS — R7881 Bacteremia: Secondary | ICD-10-CM

## 2018-06-11 HISTORY — PX: TEE WITHOUT CARDIOVERSION: SHX5443

## 2018-06-11 LAB — COMPREHENSIVE METABOLIC PANEL
ALT: 65 U/L — ABNORMAL HIGH (ref 0–44)
ANION GAP: 9 (ref 5–15)
AST: 61 U/L — ABNORMAL HIGH (ref 15–41)
Albumin: 2.1 g/dL — ABNORMAL LOW (ref 3.5–5.0)
Alkaline Phosphatase: 79 U/L (ref 38–126)
BUN: 12 mg/dL (ref 6–20)
CO2: 25 mmol/L (ref 22–32)
Calcium: 8.4 mg/dL — ABNORMAL LOW (ref 8.9–10.3)
Chloride: 99 mmol/L (ref 98–111)
Creatinine, Ser: 0.65 mg/dL (ref 0.61–1.24)
Glucose, Bld: 107 mg/dL — ABNORMAL HIGH (ref 70–99)
Potassium: 3.8 mmol/L (ref 3.5–5.1)
Sodium: 133 mmol/L — ABNORMAL LOW (ref 135–145)
Total Bilirubin: 0.6 mg/dL (ref 0.3–1.2)
Total Protein: 6.3 g/dL — ABNORMAL LOW (ref 6.5–8.1)

## 2018-06-11 LAB — CBC
HCT: 35.3 % — ABNORMAL LOW (ref 39.0–52.0)
Hemoglobin: 11.4 g/dL — ABNORMAL LOW (ref 13.0–17.0)
MCH: 28.5 pg (ref 26.0–34.0)
MCHC: 32.3 g/dL (ref 30.0–36.0)
MCV: 88.3 fL (ref 80.0–100.0)
Platelets: 704 10*3/uL — ABNORMAL HIGH (ref 150–400)
RBC: 4 MIL/uL — ABNORMAL LOW (ref 4.22–5.81)
RDW: 13.3 % (ref 11.5–15.5)
WBC: 10.3 10*3/uL (ref 4.0–10.5)
nRBC: 0 % (ref 0.0–0.2)

## 2018-06-11 LAB — MAGNESIUM: Magnesium: 1.9 mg/dL (ref 1.7–2.4)

## 2018-06-11 SURGERY — ECHOCARDIOGRAM, TRANSESOPHAGEAL
Anesthesia: Monitor Anesthesia Care

## 2018-06-11 MED ORDER — LIDOCAINE HCL (CARDIAC) PF 100 MG/5ML IV SOSY
PREFILLED_SYRINGE | INTRAVENOUS | Status: DC | PRN
  Administered 2018-06-11: 40 mg via INTRATRACHEAL

## 2018-06-11 MED ORDER — NALOXONE HCL 0.4 MG/ML IJ SOLN
INTRAMUSCULAR | Status: AC
  Administered 2018-06-12: 01:00:00
  Filled 2018-06-11: qty 1

## 2018-06-11 MED ORDER — PROPOFOL 10 MG/ML IV BOLUS
INTRAVENOUS | Status: DC | PRN
  Administered 2018-06-11: 40 mg via INTRAVENOUS
  Administered 2018-06-11: 30 mg via INTRAVENOUS
  Administered 2018-06-11 (×2): 50 mg via INTRAVENOUS
  Administered 2018-06-11: 40 mg via INTRAVENOUS

## 2018-06-11 MED ORDER — SODIUM CHLORIDE 0.9 % IV SOLN
INTRAVENOUS | Status: DC | PRN
  Administered 2018-06-11: 12:00:00 via INTRAVENOUS

## 2018-06-11 NOTE — Interval H&P Note (Signed)
History and Physical Interval Note:  06/11/2018 12:25 PM  Todd Hooper  has presented today for surgery, with the diagnosis of BACTEREMIA  The various methods of treatment have been discussed with the patient and family. After consideration of risks, benefits and other options for treatment, the patient has consented to  Procedure(s): TRANSESOPHAGEAL ECHOCARDIOGRAM (TEE) (N/A) as a surgical intervention .  The patient's history has been reviewed, patient examined, no change in status, stable for surgery.  I have reviewed the patient's chart and labs.  Questions were answered to the patient's satisfaction.     Kristeen Miss

## 2018-06-11 NOTE — Progress Notes (Signed)
Returned to unit stable, oriented and alert.placed in bed.

## 2018-06-11 NOTE — Anesthesia Preprocedure Evaluation (Signed)
Anesthesia Evaluation  Patient identified by MRN, date of birth, ID band Patient awake    Reviewed: Allergy & Precautions, NPO status , Patient's Chart, lab work & pertinent test results  Airway Mallampati: I  TM Distance: >3 FB Neck ROM: Full    Dental  (+) Missing,    Pulmonary pneumonia, Current Smoker,     + decreased breath sounds      Cardiovascular negative cardio ROS   Rhythm:Regular Rate:Normal     Neuro/Psych negative neurological ROS  negative psych ROS   GI/Hepatic negative GI ROS, Neg liver ROS,   Endo/Other  negative endocrine ROS  Renal/GU negative Renal ROS     Musculoskeletal negative musculoskeletal ROS (+)   Abdominal Normal abdominal exam  (+)   Peds  Hematology negative hematology ROS (+)   Anesthesia Other Findings   Reproductive/Obstetrics                             Lab Results  Component Value Date   WBC 10.3 06/11/2018   HGB 11.4 (L) 06/11/2018   HCT 35.3 (L) 06/11/2018   MCV 88.3 06/11/2018   PLT 704 (H) 06/11/2018   Lab Results  Component Value Date   CREATININE 0.65 06/11/2018   BUN 12 06/11/2018   NA 133 (L) 06/11/2018   K 3.8 06/11/2018   CL 99 06/11/2018   CO2 25 06/11/2018   No results found for: INR, PROTIME  EKG: sinus tachycardia.  Anesthesia Physical  Anesthesia Plan  ASA: III  Anesthesia Plan: MAC   Post-op Pain Management:    Induction: Intravenous  PONV Risk Score and Plan: 2 and Ondansetron, Dexamethasone and Midazolam  Airway Management Planned: Natural Airway  Additional Equipment:   Intra-op Plan:   Post-operative Plan:   Informed Consent: I have reviewed the patients History and Physical, chart, labs and discussed the procedure including the risks, benefits and alternatives for the proposed anesthesia with the patient or authorized representative who has indicated his/her understanding and acceptance.      Dental advisory given  Plan Discussed with: CRNA  Anesthesia Plan Comments:         Anesthesia Quick Evaluation

## 2018-06-11 NOTE — Progress Notes (Addendum)
[address]       [employer] [address]             [phone]      7 Days Post-Op Procedure(s) (LRB): VIDEO BRONCHOSCOPY (N/A) Packing of Left Upper Arm Wound (Left) Left Video Assisted Thoracoscopy (Vats)/Decortication and Drainage of Empyema (Left) Subjective: Feels well, plan for TEE today  Objective: Vital signs in last 24 hours: Temp:  [97.9 F (36.6 C)-99.8 F (37.7 C)] 98 F (36.7 C) (02/28 0402) Pulse Rate:  [77-90] 90 (02/27 1700) Cardiac Rhythm: Normal sinus rhythm (02/28 0353) Resp:  [14-21] 21 (02/28 0402) BP: (111-146)/(70-81) 117/78 (02/28 0402) SpO2:  [94 %-97 %] 97 % (02/28 0017)  Hemodynamic parameters for last 24 hours:    Intake/Output from previous day: 02/27 0701 - 02/28 0700 In: 2037.2 [P.O.:480; I.V.:1157.2; IV Piggyback:400] Out: 2126 [Urine:2125; Stool:1] Intake/Output this shift: No intake/output data recorded.  General appearance: alert, cooperative and no distress Heart: regular rate and rhythm Lungs: clear to auscultation bilaterally Abdomen: benign Extremities: no edema or calf tenderness Wound: incis healing well, left arm improving  Lab Results: Recent Labs    06/11/18 0249  WBC 10.3  HGB 11.4*  HCT 35.3*  PLT 704*   BMET:  Recent Labs    06/11/18 0249  NA 133*  K 3.8  CL 99  CO2 25  GLUCOSE 107*  BUN 12  CREATININE 0.65  CALCIUM 8.4*    PT/INR: No results for input(s): LABPROT, INR in the last 72 hours. ABG    Component Value Date/Time   PHART 7.467 (H) 06/05/2018 0450   HCO3 26.6 06/05/2018 0450   O2SAT 93.6 06/05/2018 0450   CBG (last 3)  No results for input(s): GLUCAP in the last 72 hours.  Meds Scheduled Meds: . bisacodyl  10 mg Oral Daily  . gabapentin  300 mg Oral TID  . guaiFENesin  600 mg Oral BID  . hydrOXYzine  25 mg Oral TID  . hydrOXYzine  50 mg Oral QHS  . methocarbamol  750 mg Oral Q6H  . multivitamin with minerals  1 tablet Oral Daily  . nicotine  21 mg  Transdermal Daily  . polyethylene glycol  17 g Oral Daily  . senna  2 tablet Oral QHS  . senna-docusate  1 tablet Oral QHS  . sodium chloride flush  10-40 mL Intracatheter Q12H   Continuous Infusions: . sodium chloride 50 mL/hr at 06/10/18 2130  . sodium chloride    . potassium chloride    . vancomycin 1,000 mg (06/11/18 0009)   PRN Meds:.acetaminophen, guaiFENesin-codeine, ipratropium-albuterol, ondansetron **OR** [DISCONTINUED] ondansetron (ZOFRAN) IV, oxyCODONE, potassium chloride, traMADol, traZODone  Xrays No results found.  Assessment/Plan: S/P Procedure(s) (LRB): VIDEO BRONCHOSCOPY (N/A) Packing of Left Upper Arm Wound (Left) Left Video Assisted Thoracoscopy (Vats)/Decortication and Drainage of Empyema (Left)  1 conts to improve in post surgical recovery 2 hemodyn stable,BP well controlled, sats good on RA- cont to push routine pulm toilet 3 conts ABX as outlined per ID 4 minor hyponatremia 5 normal renal function  6 no leukocytosis 7 H/H is stable 8 increased reactive thrombocytosis- monitor 9 TEE later today 10 push rehab as able 11 disposition pending with significant social issues  LOS: 8 days    Rowe Clack PA-C 06/11/2018 Pager [phone]  I have seen and examined Todd Hooper and agree with the above assessment  and plan.  Delight Ovens MD Beeper [phone] Office [phone] 06/11/2018 8:24  AM

## 2018-06-11 NOTE — H&P (View-Only) (Signed)
    [address]       [place] [address]             [phone]      7 Days Post-Op Procedure(s) (LRB): VIDEO BRONCHOSCOPY (N/A) Packing of Left Upper Arm Wound (Left) Left Video Assisted Thoracoscopy (Vats)/Decortication and Drainage of Empyema (Left) Subjective: Feels well, plan for TEE today  Objective: Vital signs in last 24 hours: Temp:  [97.9 F (36.6 C)-99.8 F (37.7 C)] 98 F (36.7 C) (02/28 0402) Pulse Rate:  [77-90] 90 (02/27 1700) Cardiac Rhythm: Normal sinus rhythm (02/28 0353) Resp:  [14-21] 21 (02/28 0402) BP: (111-146)/(70-81) 117/78 (02/28 0402) SpO2:  [94 %-97 %] 97 % (02/28 0017)  Hemodynamic parameters for last 24 hours:    Intake/Output from previous day: 02/27 0701 - 02/28 0700 In: 2037.2 [P.O.:480; I.V.:1157.2; IV Piggyback:400] Out: 2126 [Urine:2125; Stool:1] Intake/Output this shift: No intake/output data recorded.  General appearance: alert, cooperative and no distress Heart: regular rate and rhythm Lungs: clear to auscultation bilaterally Abdomen: benign Extremities: no edema or calf tenderness Wound: incis healing well, left arm improving  Lab Results: Recent Labs    06/11/18 0249  WBC 10.3  HGB 11.4*  HCT 35.3*  PLT 704*   BMET:  Recent Labs    06/11/18 0249  NA 133*  K 3.8  CL 99  CO2 25  GLUCOSE 107*  BUN 12  CREATININE 0.65  CALCIUM 8.4*    PT/INR: No results for input(s): LABPROT, INR in the last 72 hours. ABG    Component Value Date/Time   PHART 7.467 (H) 06/05/2018 0450   HCO3 26.6 06/05/2018 0450   O2SAT 93.6 06/05/2018 0450   CBG (last 3)  No results for input(s): GLUCAP in the last 72 hours.  Meds Scheduled Meds: . bisacodyl  10 mg Oral Daily  . gabapentin  300 mg Oral TID  . guaiFENesin  600 mg Oral BID  . hydrOXYzine  25 mg Oral TID  . hydrOXYzine  50 mg Oral QHS  . methocarbamol  750 mg Oral Q6H  . multivitamin with minerals  1 tablet Oral Daily  . nicotine  21 mg  Transdermal Daily  . polyethylene glycol  17 g Oral Daily  . senna  2 tablet Oral QHS  . senna-docusate  1 tablet Oral QHS  . sodium chloride flush  10-40 mL Intracatheter Q12H   Continuous Infusions: . sodium chloride 50 mL/hr at 06/10/18 2130  . sodium chloride    . potassium chloride    . vancomycin 1,000 mg (06/11/18 0009)   PRN Meds:.acetaminophen, guaiFENesin-codeine, ipratropium-albuterol, ondansetron **OR** [DISCONTINUED] ondansetron (ZOFRAN) IV, oxyCODONE, potassium chloride, traMADol, traZODone  Xrays No results found.  Assessment/Plan: S/P Procedure(s) (LRB): VIDEO BRONCHOSCOPY (N/A) Packing of Left Upper Arm Wound (Left) Left Video Assisted Thoracoscopy (Vats)/Decortication and Drainage of Empyema (Left)  1 conts to improve in post surgical recovery 2 hemodyn stable,BP well controlled, sats good on RA- cont to push routine pulm toilet 3 conts ABX as outlined per ID 4 minor hyponatremia 5 normal renal function  6 no leukocytosis 7 H/H is stable 8 increased reactive thrombocytosis- monitor 9 TEE later today 10 push rehab as able 11 disposition pending with significant social issues  LOS: 8 days    Wayne E Gold PA-C 06/11/2018 Pager [phone]  I have seen and examined Todd Hooper and agree with the above assessment  and plan.  Wade Sigala B Nicholaos Schippers MD Beeper [phone] Office [phone] 06/11/2018 8:24   AM

## 2018-06-11 NOTE — Progress Notes (Signed)
PROGRESS NOTE    Todd Hooper  VOZ:[id number] DOB: 02-11-1966 DOA: 06/03/2018 PCP: Patient, No Pcp Per   Brief Narrative:  53 year old with history of tobacco use, IV drug abuse, cocaine use came to the hospital with complaints of left-sided chest pain and shortness of breath.  He was found to have left-sided partially loculated pleural effusion with multifocal infiltrates on the CT of the chest.  Underwent thoracentesis by IR followed by VATS by cardiothoracic surgery on 2/21.  Cultures ended growing MRSA.  Cardiology was consulted for TEE as recommended by infectious disease.  Assessment & Plan:   Principal Problem:   PNA (pneumonia)/Lt Sided Efusion/Empyema Active Problems:   Polysubstance abuse -- IVDU (Cocaine)   Tobacco abuse   Empyema of left pleural space (HCC)  Multifocal pneumonia with MRSA Left Arm Abscess 2/2 MRSA and Klebsiella  MRSA Empyema of the left lung and abscess of the left arm due to IV drug abuse -Status post bronchoscopy, left VATS, drainage of empyema and decortication as well as I&D of L arm on 2/21  - Cx from 2/21 with klebsiella and MRSA from I&D of left arm - 2/20 cx with MRSA from thoracentesis - 2/21 bronchial washings with normal respiratory flora - 2/21 pleural fluid cx with MRSA - yeast observed, will plan to discuss with ID - Follow AFB cx and fungal cx - blood cx from 2/20 NGTD x 5 - Repeat blood cx from 2/26 NGTD - Chest tubes now removed by CT surgery - s/p TEE on 2/28 without noted vegetation - zosyn 2/20 - 2/24, cefazolin from 2/25 - 2/26 - vancomycin from 2/20 - present.  Per ID, recommending 10-14 days of IV abx prior to transitioning to PO abx. -Supplemental oxygen prn, wean as tolerated, bronchodilators PRN.  IS. - ID c/s, appreciate recs -Hep B - neg, HCV Abx elevated, will check quantitative   Polysubstance abuse -Counseled to quit using this.  Would benefit from referral program upon discharge. He states he's not interested in  suboxone at this time.  Active tobacco use - Nicotine patch as needed.  Thrombocytosis: reactive, follow  Elevated LFT's: follow Hep C quant as noted above, continue to monitor  Per previous provider "I would preferably continue patient on telemetry sitter at this time as there is concerns for possible suspicious activity with his family member abusing IV line to administer other drugs not prescribed in the hospital.  Nursing staff is aware of this.  If necessary we will have to restrict and/or stop visitors."  DVT prophylaxis: SCD Code Status: full Family Communication: none at bedside Disposition Plan: pending   Consultants:   CT surgery  ID  Procedures:  TEE IMPRESSIONS    1. There are no thrombi in the left atrial appendage.  2. The mitral valve is normal in structure. No mitral valve vegetation visualized.  3. The tricuspid valve was normal in structure.  4. The aortic valve is normal in structure.  5. No vegetation seen on the PV.  6. The aortic root and ascending aorta are normal in size and structure.  7. The left ventricle has normal systolic function, with an ejection fraction of 60-65%.  TTE IMPRESSIONS    1. The left ventricle has normal systolic function, with an ejection fraction of 55-60%. The cavity size was normal. Left ventricular diastolic Doppler parameters are indeterminate. No evidence of left ventricular regional wall motion abnormalities.  2. The mitral valve is degenerative. Mild thickening of the mitral valve leaflet. Mild calcification of  the mitral valve leaflet. Anterior leaflet shows more focal, moderate thickening.  3. The aortic root is normal in size and structure.  4. The tricuspid valve is normal in structure.  5. There is mild calcification at the aortic valve leaflet tips. Mild aortic annular calcification noted.  6. No evidence of left ventricular regional wall motion abnormalities.  7. The right ventricle has normal systolic  function. The cavity was normal. There is no increase in right ventricular wall thickness. Right ventricular systolic pressure could not be assessed.  2/21 Bronchoscopy, left video-assisted thoracoscopy, drainage of empyema and decortication, packing of open left upper arm infected IV site.  Antimicrobials: Anti-infectives (From admission, onward)   Start     Dose/Rate Route Frequency Ordered Stop   06/09/18 0100  vancomycin (VANCOCIN) IVPB 1000 mg/200 mL premix     1,000 mg 200 mL/hr over 60 Minutes Intravenous Every 8 hours 06/08/18 1826     06/08/18 1200  ceFAZolin (ANCEF) IVPB 2g/100 mL premix  Status:  Discontinued     2 g 200 mL/hr over 30 Minutes Intravenous Every 8 hours 06/08/18 0817 06/09/18 1429   06/07/18 1300  vancomycin (VANCOCIN) IVPB 1000 mg/200 mL premix  Status:  Discontinued     1,000 mg 200 mL/hr over 60 Minutes Intravenous Every 8 hours 06/07/18 1208 06/08/18 1826   06/04/18 0400  vancomycin (VANCOCIN) IVPB 1000 mg/200 mL premix  Status:  Discontinued     1,000 mg 200 mL/hr over 60 Minutes Intravenous Every 12 hours 06/03/18 1437 06/07/18 1208   06/03/18 2200  piperacillin-tazobactam (ZOSYN) IVPB 3.375 g  Status:  Discontinued     3.375 g 12.5 mL/hr over 240 Minutes Intravenous Every 8 hours 06/03/18 1434 06/08/18 0817   06/03/18 1500  Ampicillin-Sulbactam (UNASYN) 3 g in sodium chloride 0.9 % 100 mL IVPB  Status:  Discontinued     3 g 200 mL/hr over 30 Minutes Intravenous Every 8 hours 06/03/18 1349 06/03/18 1419   06/03/18 1500  vancomycin (VANCOCIN) 1,500 mg in sodium chloride 0.9 % 500 mL IVPB     1,500 mg 250 mL/hr over 120 Minutes Intravenous  Once 06/03/18 1433 06/03/18 1805   06/03/18 1430  piperacillin-tazobactam (ZOSYN) IVPB 3.375 g     3.375 g 100 mL/hr over 30 Minutes Intravenous  Once 06/03/18 1430 06/03/18 1608   06/03/18 1330  azithromycin (ZITHROMAX) 500 mg in sodium chloride 0.9 % 250 mL IVPB     500 mg 250 mL/hr over 60 Minutes Intravenous  Once  06/03/18 1315 06/03/18 1526   06/03/18 1130  cefTRIAXone (ROCEPHIN) 1 g in sodium chloride 0.9 % 100 mL IVPB     1 g 200 mL/hr over 30 Minutes Intravenous  Once 06/03/18 1115 06/03/18 1216     Subjective: No complaints today Some general soreness  Objective: Vitals:   06/11/18 1302 06/11/18 1310 06/11/18 1321 06/11/18 1334  BP: (!) 94/57 113/67 111/72 115/76  Pulse: 78 71 77 66  Resp: 20 18 17  (!) 22  Temp: 99.2 F (37.3 C)   98.4 F (36.9 C)  TempSrc: Oral   Oral  SpO2: 98% 100% 99% 98%  Weight:      Height:        Intake/Output Summary (Last 24 hours) at 06/11/2018 1532 Last data filed at 06/11/2018 1252 Gross per 24 hour  Intake 1521 ml  Output 2500 ml  Net -979 ml   Filed Weights   06/03/18 0907 06/04/18 1023  Weight: 74.8 kg 74.8 kg  Examination:  General exam: Appears calm and comfortable  Respiratory system: Clear to auscultation. Respiratory effort normal.  L sided incisions healing well. Cardiovascular system: S1 & S2 heard, RRR. Gastrointestinal system: Abdomen is nondistended, soft and nontender. Central nervous system: Alert and oriented. No focal neurological deficits. Extremities: no LEE Skin: No rashes, lesions or ulcers Psychiatry: Judgement and insight appear normal. Mood & affect appropriate.     Data Reviewed: I have personally reviewed following labs and imaging studies  CBC: Recent Labs  Lab 06/05/18 0528 06/06/18 0322 06/07/18 0305 06/08/18 0229 06/11/18 0249  WBC 13.3* 15.0* 12.7* 12.9* 10.3  NEUTROABS  --   --  8.5*  --   --   HGB 11.0* 11.0* 11.2* 11.8* 11.4*  HCT 34.6* 33.7* 34.9* 35.5* 35.3*  MCV 88.9 88.9 89.3 87.9 88.3  PLT 432* 441* 481* 490* 704*   Basic Metabolic Panel: Recent Labs  Lab 06/05/18 0528 06/06/18 0322 06/07/18 0305 06/08/18 0229 06/11/18 0249  NA 135 131* 134* 134* 133*  K 4.0 3.9 3.8 4.2 3.8  CL 102 97* 100 100 99  CO2 21* 25  GLUCOSE 112* 99 130* 106* 107*  BUN 7 5* 5* 8 12    CREATININE 0.74 0.70 0.73 0.66 0.65  CALCIUM 7.8* 8.0* 8.1* 8.3* 8.4*  MG  --   --   --   --  1.9   GFR: Estimated Creatinine Clearance: 104.5 mL/min (by C-G formula based on SCr of 0.65 mg/dL). Liver Function Tests: Recent Labs  Lab 06/06/18 0322 06/11/18 0249  AST 21 61*  ALT 17 65*  ALKPHOS 78 79  BILITOT 0.7 0.6  PROT 6.0* 6.3*  ALBUMIN 1.8* 2.1*   No results for input(s): LIPASE, AMYLASE in the last 168 hours. No results for input(s): AMMONIA in the last 168 hours. Coagulation Profile: No results for input(s): INR, PROTIME in the last 168 hours. Cardiac Enzymes: No results for input(s): CKTOTAL, CKMB, CKMBINDEX, TROPONINI in the last 168 hours. BNP (last 3 results) No results for input(s): PROBNP in the last 8760 hours. HbA1C: No results for input(s): HGBA1C in the last 72 hours. CBG: No results for input(s): GLUCAP in the last 168 hours. Lipid Profile: No results for input(s): CHOL, HDL, LDLCALC, TRIG, CHOLHDL, LDLDIRECT in the last 72 hours. Thyroid Function Tests: No results for input(s): TSH, T4TOTAL, FREET4, T3FREE, THYROIDAB in the last 72 hours. Anemia Panel: No results for input(s): VITAMINB12, FOLATE, FERRITIN, TIBC, IRON, RETICCTPCT in the last 72 hours. Sepsis Labs: No results for input(s): PROCALCITON, LATICACIDVEN in the last 168 hours.  Recent Results (from the past 240 hour(s))  Blood culture (routine x 2)     Status: None   Collection Time: 06/03/18 11:29 AM  Result Value Ref Range Status   Specimen Description RIGHT ANTECUBITAL  Final   Special Requests   Final    BOTTLES DRAWN AEROBIC AND ANAEROBIC Blood Culture adequate volume   Culture   Final    NO GROWTH 5 DAYS Performed at [facility], [address] [place], [place] [address]    Report Status 06/08/2018 FINAL  Final  Blood culture (routine x 2)     Status: None   Collection Time: 06/03/18 11:29 AM  Result Value Ref Range Status   Specimen Description BLOOD RIGHT WRIST  Final    Special Requests   Final    BOTTLES DRAWN AEROBIC AND ANAEROBIC Blood Culture adequate volume   Culture   Final    NO  GROWTH 5 DAYS Performed at [facility], [address] [place], [place] [address]    Report Status 06/08/2018 FINAL  Final  Fungus Culture With Stain     Status: None (Preliminary result)   Collection Time: 06/03/18  1:58 PM  Result Value Ref Range Status   Fungus Stain Final report  Final    Comment: (NOTE) Performed At: [facility] [address] [place], [place] [id number] Jolene Schimke MD XB:[id number]    Fungus (Mycology) Culture PENDING  Incomplete   Fungal Source PLEURAL  Final    Comment: LEFT Performed at [facility], [address] [place], [place] [address]   Acid Fast Smear (AFB)     Status: None   Collection Time: 06/03/18  1:58 PM  Result Value Ref Range Status   AFB Specimen Processing Concentration  Final   Acid Fast Smear Negative  Final    Comment: (NOTE) Performed At: [facility] [address] [place], [place] [id number] Jolene Schimke MD [id number]    Source (AFB) PLEURAL  Final    Comment: LEFT Performed at [facility], [address] [place], [place] [address]   Gram stain     Status: None   Collection Time: 06/03/18  1:58 PM  Result Value Ref Range Status   Specimen Description PLEURAL LEFT  Final   Special Requests NONE  Final   Gram Stain   Final    CYTOSPIN SMEAR NO ORGANISMS SEEN WBC PRESENT,BOTH PMN AND MONONUCLEAR Performed at [facility], [address] [place], [place] [address]    Report Status 06/03/2018 FINAL  Final  Culture, body fluid-bottle     Status: Abnormal   Collection Time: 06/03/18  1:58 PM  Result Value Ref Range Status   Specimen Description   Final    PLEURAL LEFT Performed at [facility], [address], [place], [place] [address]    Special Requests   Final    BOTTLES DRAWN AEROBIC AND ANAEROBIC 10CC Performed at [facility], [address], [place], [place] [address]      Gram Stain   Final    GRAM POSITIVE COCCI IN CLUSTERS WBC PRESENT, PREDOMINANTLY PMN BOTH ANA AND AEB BOTTLES Gram Stain Report Called to,Read Back By and Verified With: GRAHAM,A AT 0655 ON 06/04/18 BY HUFFINES,S. Performed at [facility], [address] [place], [place] [address]    Culture METHICILLIN RESISTANT STAPHYLOCOCCUS AUREUS (A)  Final   Report Status 06/06/2018 FINAL  Final   Organism ID, Bacteria METHICILLIN RESISTANT STAPHYLOCOCCUS AUREUS  Final      Susceptibility   Methicillin resistant staphylococcus aureus - MIC*    CIPROFLOXACIN >=8 RESISTANT Resistant     ERYTHROMYCIN >=8 RESISTANT Resistant     GENTAMICIN <=0.5 SENSITIVE Sensitive     OXACILLIN >=4 RESISTANT Resistant     TETRACYCLINE <=1 SENSITIVE Sensitive     VANCOMYCIN <=0.5 SENSITIVE Sensitive     TRIMETH/SULFA <=10 SENSITIVE Sensitive     CLINDAMYCIN >=8 RESISTANT Resistant     RIFAMPIN <=0.5 SENSITIVE Sensitive     Inducible Clindamycin NEGATIVE Sensitive     * METHICILLIN RESISTANT STAPHYLOCOCCUS AUREUS  Fungus Culture Result     Status: None   Collection Time: 06/03/18  1:58 PM  Result Value Ref Range Status   Result 1 Comment  Final    Comment: (NOTE) KOH/Calcofluor preparation:  no fungus observed. Performed At: [facility] [address] [place], [place] [id number] Jolene Schimke MD YS:[id number]   Aerobic Culture (  superficial specimen)     Status: None   Collection Time: 06/03/18  5:23 PM  Result Value Ref Range Status   Specimen Description   Final    ABSCESS Performed at [facility], [address] [place], [place] [address]    Special Requests   Final    NONE Performed at [facility], [address] [place], [place] [address]    Gram Stain   Final    ABUNDANT WBC PRESENT, PREDOMINANTLY PMN RARE GRAM POSITIVE COCCI Performed at [facility], [address] [place], [place] [address]    Culture   Final    ABUNDANT METHICILLIN RESISTANT STAPHYLOCOCCUS  AUREUS FEW KLEBSIELLA PNEUMONIAE    Report Status 06/06/2018 FINAL  Final   Organism ID, Bacteria KLEBSIELLA PNEUMONIAE  Final   Organism ID, Bacteria METHICILLIN RESISTANT STAPHYLOCOCCUS AUREUS  Final      Susceptibility   Klebsiella pneumoniae - MIC*    AMPICILLIN >=32 RESISTANT Resistant     CEFAZOLIN <=4 SENSITIVE Sensitive     CEFEPIME <=1 SENSITIVE Sensitive     CEFTAZIDIME <=1 SENSITIVE Sensitive     CEFTRIAXONE <=1 SENSITIVE Sensitive     CIPROFLOXACIN <=0.25 SENSITIVE Sensitive     GENTAMICIN <=1 SENSITIVE Sensitive     IMIPENEM <=0.25 SENSITIVE Sensitive     TRIMETH/SULFA <=20 SENSITIVE Sensitive     AMPICILLIN/SULBACTAM 4 SENSITIVE Sensitive     PIP/TAZO <=4 SENSITIVE Sensitive     Extended ESBL NEGATIVE Sensitive     * FEW KLEBSIELLA PNEUMONIAE   Methicillin resistant staphylococcus aureus - MIC*    CIPROFLOXACIN >=8 RESISTANT Resistant     ERYTHROMYCIN >=8 RESISTANT Resistant     GENTAMICIN <=0.5 SENSITIVE Sensitive     OXACILLIN >=4 RESISTANT Resistant     TETRACYCLINE <=1 SENSITIVE Sensitive     VANCOMYCIN 1 SENSITIVE Sensitive     TRIMETH/SULFA <=10 SENSITIVE Sensitive     CLINDAMYCIN >=8 RESISTANT Resistant     RIFAMPIN <=0.5 SENSITIVE Sensitive     Inducible Clindamycin NEGATIVE Sensitive     * ABUNDANT METHICILLIN RESISTANT STAPHYLOCOCCUS AUREUS  MRSA PCR Screening     Status: None   Collection Time: 06/04/18 10:01 AM  Result Value Ref Range Status   MRSA by PCR NEGATIVE NEGATIVE Final    Comment:        The GeneXpert MRSA Assay (FDA approved for NASAL specimens only), is one component of a comprehensive MRSA colonization surveillance program. It is not intended to diagnose MRSA infection nor to guide or monitor treatment for MRSA infections. Performed at [facility], [address] [place], [place] [address]   Aerobic Culture (superficial specimen)     Status: None   Collection Time: 06/04/18 11:51 AM  Result Value Ref Range Status    Specimen Description WOUND  Final   Special Requests NONE  Final   Gram Stain   Final    FEW WBC PRESENT,BOTH PMN AND MONONUCLEAR NO ORGANISMS SEEN RESULT CALLED TO, READ BACK BY AND VERIFIED WITH: Wynetta FinesE. Gerhardt MD 12:35 06/04/18 ([email or web address]) Performed at [facility], [address] [place], [place] [address]    Culture   Final    FEW METHICILLIN RESISTANT STAPHYLOCOCCUS AUREUS RARE KLEBSIELLA PNEUMONIAE    Report Status 06/06/2018 FINAL  Final   Organism ID, Bacteria KLEBSIELLA PNEUMONIAE  Final   Organism ID, Bacteria METHICILLIN RESISTANT STAPHYLOCOCCUS AUREUS  Final      Susceptibility   Klebsiella pneumoniae - MIC*  AMPICILLIN >=32 RESISTANT Resistant     CEFAZOLIN <=4 SENSITIVE Sensitive     CEFEPIME <=1 SENSITIVE Sensitive     CEFTAZIDIME <=1 SENSITIVE Sensitive     CEFTRIAXONE <=1 SENSITIVE Sensitive     CIPROFLOXACIN <=0.25 SENSITIVE Sensitive     GENTAMICIN <=1 SENSITIVE Sensitive     IMIPENEM <=0.25 SENSITIVE Sensitive     TRIMETH/SULFA <=20 SENSITIVE Sensitive     AMPICILLIN/SULBACTAM 8 SENSITIVE Sensitive     PIP/TAZO <=4 SENSITIVE Sensitive     Extended ESBL NEGATIVE Sensitive     * RARE KLEBSIELLA PNEUMONIAE   Methicillin resistant staphylococcus aureus - MIC*    CIPROFLOXACIN >=8 RESISTANT Resistant     ERYTHROMYCIN >=8 RESISTANT Resistant     GENTAMICIN <=0.5 SENSITIVE Sensitive     OXACILLIN RESISTANT Resistant     TETRACYCLINE <=1 SENSITIVE Sensitive     VANCOMYCIN <=0.5 SENSITIVE Sensitive     TRIMETH/SULFA <=10 SENSITIVE Sensitive     CLINDAMYCIN >=8 RESISTANT Resistant     RIFAMPIN <=0.5 SENSITIVE Sensitive     Inducible Clindamycin NEGATIVE Sensitive     * FEW METHICILLIN RESISTANT STAPHYLOCOCCUS AUREUS  Culture, respiratory     Status: None   Collection Time: 06/04/18 12:02 PM  Result Value Ref Range Status   Specimen Description BRONCHIAL WASHINGS  Final   Special Requests NONE  Final   Gram Stain   Final    RARE WBC PRESENT, PREDOMINANTLY  PMN FEW SQUAMOUS EPITHELIAL CELLS PRESENT FEW GRAM POSITIVE RODS RARE GRAM POSITIVE COCCI RARE GRAM NEGATIVE RODS    Culture   Final    FEW Consistent with normal respiratory flora. Performed at [facility], [address] [place], [place] [address]    Report Status 06/06/2018 FINAL  Final  Acid Fast Smear (AFB)     Status: None   Collection Time: 06/04/18 12:02 PM  Result Value Ref Range Status   AFB Specimen Processing Concentration  Final   Acid Fast Smear Negative  Final    Comment: (NOTE) Performed At: [facility] [address] [place], [place] [id number] Jolene Schimke MD [id number]    Source (AFB) PLEURAL  Final  Body fluid culture     Status: None   Collection Time: 06/04/18 12:31 PM  Result Value Ref Range Status   Specimen Description PLEURAL  Final   Special Requests LEFT  Final   Gram Stain   Final    FEW WBC PRESENT,BOTH PMN AND MONONUCLEAR MODERATE GRAM POSITIVE COCCI Performed at [facility], [address] [place], [place] [address]    Culture FEW METHICILLIN RESISTANT STAPHYLOCOCCUS AUREUS  Final   Report Status 06/06/2018 FINAL  Final   Organism ID, Bacteria METHICILLIN RESISTANT STAPHYLOCOCCUS AUREUS  Final      Susceptibility   Methicillin resistant staphylococcus aureus - MIC*    CIPROFLOXACIN >=8 RESISTANT Resistant     ERYTHROMYCIN >=8 RESISTANT Resistant     GENTAMICIN <=0.5 SENSITIVE Sensitive     OXACILLIN >=4 RESISTANT Resistant     TETRACYCLINE <=1 SENSITIVE Sensitive     VANCOMYCIN <=0.5 SENSITIVE Sensitive     TRIMETH/SULFA <=10 SENSITIVE Sensitive     CLINDAMYCIN >=8 RESISTANT Resistant     RIFAMPIN <=0.5 SENSITIVE Sensitive     Inducible Clindamycin NEGATIVE Sensitive     * FEW METHICILLIN RESISTANT STAPHYLOCOCCUS AUREUS  Fungus Culture With Stain     Status: None (Preliminary result)   Collection Time: 06/04/18 12:31 PM  Result Value Ref Range  Status   Fungus Stain Final report  Final    Comment:  (NOTE) Performed At: [facility] [address] [place], [place] [id number] Jolene Schimke MD [id number]:[id number]    Fungus (Mycology) Culture PENDING  Incomplete   Fungal Source BRONCHIAL WASHINGS  Final  Aerobic/Anaerobic Culture (surgical/deep wound)     Status: None   Collection Time: 06/04/18 12:31 PM  Result Value Ref Range Status   Specimen Description PLEURAL  Final   Special Requests NONE  Final   Gram Stain   Final    ABUNDANT WBC PRESENT,BOTH PMN AND MONONUCLEAR RARE GRAM POSITIVE COCCI    Culture   Final    MODERATE METHICILLIN RESISTANT STAPHYLOCOCCUS AUREUS NO ANAEROBES ISOLATED Performed at [facility], [address] [place], [place] [address]    Report Status 06/09/2018 FINAL  Final   Organism ID, Bacteria METHICILLIN RESISTANT STAPHYLOCOCCUS AUREUS  Final      Susceptibility   Methicillin resistant staphylococcus aureus - MIC*    CIPROFLOXACIN >=8 RESISTANT Resistant     ERYTHROMYCIN >=8 RESISTANT Resistant     GENTAMICIN <=0.5 SENSITIVE Sensitive     OXACILLIN RESISTANT Resistant     TETRACYCLINE <=1 SENSITIVE Sensitive     VANCOMYCIN <=0.5 SENSITIVE Sensitive     TRIMETH/SULFA <=10 SENSITIVE Sensitive     CLINDAMYCIN >=8 RESISTANT Resistant     RIFAMPIN <=0.5 SENSITIVE Sensitive     Inducible Clindamycin NEGATIVE Sensitive     * MODERATE METHICILLIN RESISTANT STAPHYLOCOCCUS AUREUS  Acid Fast Smear (AFB)     Status: None   Collection Time: 06/04/18 12:31 PM  Result Value Ref Range Status   AFB Specimen Processing Concentration  Final   Acid Fast Smear Negative  Final    Comment: (NOTE) Performed At: [facility] [address] [place], [place] [id number] Jolene Schimke MD [id number]    Source (AFB) BRONCHIAL WASHINGS  Final  Fungus Culture Result     Status: None   Collection Time: 06/04/18 12:31 PM  Result Value Ref Range Status   Result 1 Yeast observed  Final    Comment: (NOTE) Performed At: [facility] [address] [place], [place] [id number] Jolene Schimke MD [id number]   Acid Fast Smear (AFB)     Status: None   Collection Time: 06/04/18 12:35 PM  Result Value Ref Range Status   AFB Specimen Processing Concentration  Final   Acid Fast Smear Negative  Final    Comment: (NOTE) Performed At: [facility] [address] [place], [place] [id number] Jolene Schimke MD [id number]    Source (AFB) PLEURAL  Final  Culture, blood (routine x 2)     Status: None (Preliminary result)   Collection Time: 06/09/18  6:25 AM  Result Value Ref Range Status   Specimen Description BLOOD LEFT HAND  Final   Special Requests   Final    BOTTLES DRAWN AEROBIC ONLY Blood Culture adequate volume   Culture NO GROWTH 2 DAYS  Final   Report Status PENDING  Incomplete  Culture, blood (routine x 2)     Status: None (Preliminary result)   Collection Time: 06/09/18  6:33 AM  Result Value Ref Range Status   Specimen Description BLOOD LEFT HAND  Final   Special Requests   Final    BOTTLES DRAWN AEROBIC ONLY Blood Culture adequate volume   Culture NO GROWTH 2 DAYS  Final   Report Status PENDING  Incomplete         Radiology Studies:  No results found.      Scheduled Meds: . bisacodyl  10 mg Oral Daily  . gabapentin  300 mg Oral TID  . guaiFENesin  600 mg Oral BID  . hydrOXYzine  25 mg Oral TID  . hydrOXYzine  50 mg Oral QHS  . methocarbamol  750 mg Oral Q6H  . multivitamin with minerals  1 tablet Oral Daily  . nicotine  21 mg Transdermal Daily  . polyethylene glycol  17 g Oral Daily  . senna  2 tablet Oral QHS  . senna-docusate  1 tablet Oral QHS  . sodium chloride flush  10-40 mL Intracatheter Q12H   Continuous Infusions: . sodium chloride 50 mL/hr at 06/10/18 2130  . potassium chloride    . vancomycin 1,000 mg (06/11/18 0847)     LOS: 8 days    Time spent: over 30 min    Lacretia Nicks, MD [facility] Pager AMION  If 7PM-7AM, please contact  night-coverage [email or web address] Password [facility] 06/11/2018, 3:32 PM

## 2018-06-11 NOTE — Progress Notes (Signed)
ID PROGRESS NOTE   24hr event: underwent TEE this afternoon that did not show any evidence of vegetation He remains afebrile  A/p: MRSA empyema s/p VATS - plan for 2 wks of IV vancomycin and then plan to convert to oral antibiotics  Chronic hep c without hepatic coma - will check hep c viral load

## 2018-06-11 NOTE — Transfer of Care (Signed)
Immediate Anesthesia Transfer of Care Note  Patient: Todd Hooper  Procedure(s) Performed: TRANSESOPHAGEAL ECHOCARDIOGRAM (TEE) (N/A )  Patient Location: Endoscopy Unit  Anesthesia Type:MAC  Level of Consciousness: awake, alert  and oriented  Airway & Oxygen Therapy: Patient Spontanous Breathing and Patient connected to nasal cannula oxygen  Post-op Assessment: Report given to RN, Post -op Vital signs reviewed and stable and Patient moving all extremities X 4  Post vital signs: Reviewed and stable  Last Vitals:  Vitals Value Taken Time  BP    Temp    Pulse    Resp    SpO2      Last Pain:  Vitals:   06/11/18 1216  TempSrc: Oral  PainSc: 0-No pain      Patients Stated Pain Goal: 7 (01/65/53 [id number])  Complications: No apparent anesthesia complications

## 2018-06-11 NOTE — Progress Notes (Addendum)
Pt left for endo accompanied by Nurse.

## 2018-06-12 ENCOUNTER — Inpatient Hospital Stay (HOSPITAL_COMMUNITY): Payer: Self-pay

## 2018-06-12 LAB — CBC
HCT: 34.3 % — ABNORMAL LOW (ref 39.0–52.0)
HEMOGLOBIN: 11.3 g/dL — AB (ref 13.0–17.0)
MCH: 29.2 pg (ref 26.0–34.0)
MCHC: 32.9 g/dL (ref 30.0–36.0)
MCV: 88.6 fL (ref 80.0–100.0)
Platelets: 743 10*3/uL — ABNORMAL HIGH (ref 150–400)
RBC: 3.87 MIL/uL — ABNORMAL LOW (ref 4.22–5.81)
RDW: 13.4 % (ref 11.5–15.5)
WBC: 11.8 10*3/uL — ABNORMAL HIGH (ref 4.0–10.5)
nRBC: 0 % (ref 0.0–0.2)

## 2018-06-12 LAB — COMPREHENSIVE METABOLIC PANEL
ALT: 64 U/L — AB (ref 0–44)
AST: 44 U/L — ABNORMAL HIGH (ref 15–41)
Albumin: 2.2 g/dL — ABNORMAL LOW (ref 3.5–5.0)
Alkaline Phosphatase: 98 U/L (ref 38–126)
Anion gap: 7 (ref 5–15)
BUN: 12 mg/dL (ref 6–20)
CO2: 27 mmol/L (ref 22–32)
CREATININE: 0.66 mg/dL (ref 0.61–1.24)
Calcium: 8.5 mg/dL — ABNORMAL LOW (ref 8.9–10.3)
Chloride: 102 mmol/L (ref 98–111)
GFR calc Af Amer: >60 mL/min (ref >60)
GFR calc non Af Amer: >60 mL/min (ref >60)
Glucose, Bld: 121 mg/dL — ABNORMAL HIGH (ref 70–99)
Potassium: 3.9 mmol/L (ref 3.5–5.1)
Sodium: 136 mmol/L (ref 135–145)
Total Bilirubin: 0.4 mg/dL (ref 0.3–1.2)
Total Protein: 6.6 g/dL (ref 6.5–8.1)

## 2018-06-12 LAB — HCV RNA QUANT: HCV Quantitative: NOT DETECTED IU/mL (ref >50)

## 2018-06-12 LAB — MAGNESIUM: Magnesium: 1.9 mg/dL (ref 1.7–2.4)

## 2018-06-12 MED ORDER — LUNG SURGERY BOOK
Freq: Once | Status: AC
  Administered 2018-06-12: 03:00:00
  Filled 2018-06-12: qty 1

## 2018-06-12 NOTE — Progress Notes (Signed)
Pt is adamant about leaving AMA.  States he has spoken to physician earlier this AM and understands the risks of leaving, up to and including death.  Pt verbally states an [place] phrase which he states means "its a good day to die".  Demands AMA discharge.  Paperwork signed.  RUE midline removed per this RN.  Pt dresses himself and security escorts him from the building.

## 2018-06-12 NOTE — Progress Notes (Signed)
Approximately 2350 patient's girlfriend came to room, telesitter called RN and notified that there was suspicious activity in the room. RN walked in to check on patient, patient was in the bathroom. Girlfriend announced out loud that RN was in the room, she said that he was in the bathroom having a bowel movement. RN left room to get 12am meds, girlfriend had left the room and unit. RN walked back into room with meds, patient was still in the bathroom. Knocked on the door and asked if he was okay and had no verbal response. RN opened bathroom door where patient was found on the floor, needle next to left arm, left side head landed on shower stall and was bleeding. Rapid response was called, narcan given,pt responded to narcan. He was alert and calm, stating that "he wanted to refuse any further services and leave". Dr. Bruna Potter came to assess the patient and ordered CT of head. Patient decided to stay and get CT and vancomycin.  RN went with patient for CT, once back on the floor the patients girlfriend arrived back onto the unit and was told that the patient can not have visitors at this time. She asked if she could gather her things in the room. She walked in and asked the patient if he was okay and that she would be waiting for him outside in the car. Patient said he would be leaving as soon as IV antibiotics was finished.  Will continue to encourage patient to stay and monitor.

## 2018-06-12 NOTE — Significant Event (Signed)
Rapid Response Event Note  Overview: Suspected Overdose  Initial Focused Assessment: Called by [facility] staff about patient's overdosing in the bathroom. Per staff, they found the patient on floor with a needle/syringe by his legs. RNs were the process of administering Narcan when I arrived. Patient responded to the Narcan, came too, sat up, and stated he was fine. Sustained a small laceration with a small surrounding hematoma around the LT eyebrow. Patient was able to move all extremities, denied head or neck pain, was assisted back to bed and neuro exam was done. Alert and oriented, MOE x 4, no sensory or speech loss, pupils were pinpoint but EOM were normal. SBP in the 160s and HR 110-120s. 100% on RA, not in acute distress. Code Blue was called but was cancelled.  Interventions: -- [facility] NP came to bedside -- CT HEAD STAT ordered.  Plan of Care: -- Monitor for signs of worsening respiratory status and withdrawal. -- Follow up -- CT was normal  Event Summary:    at    Call Time 2354 Arrival Time 2356 End Time 0025  Tyresa Prindiville R

## 2018-06-12 NOTE — Progress Notes (Signed)
Unable to get blood return from patients mid-line, called phlebotomy ([phone]) to draw patients lab.

## 2018-06-13 ENCOUNTER — Encounter (HOSPITAL_COMMUNITY): Payer: Self-pay | Admitting: Cardiovascular Disease

## 2018-06-14 LAB — CULTURE, BLOOD (ROUTINE X 2)
CULTURE: NO GROWTH
Culture: NO GROWTH
Special Requests: ADEQUATE
Special Requests: ADEQUATE

## 2018-06-14 NOTE — Discharge Summary (Signed)
Patient left AMA on the morning of 2/29.  I was not present at the time of his AMA discharge, but it appears he was assessed by the night NP, and left AMA after receiving his antibiotic dose that night.  See prior notes for details regarding hospitalization.

## 2018-06-14 NOTE — Anesthesia Postprocedure Evaluation (Addendum)
Anesthesia Post Note  Patient: Equities trader  Procedure(s) Performed: TRANSESOPHAGEAL ECHOCARDIOGRAM (TEE) (N/A )     Patient location during evaluation: PACU Anesthesia Type: MAC Level of consciousness: awake and alert Pain management: pain level controlled Vital Signs Assessment: post-procedure vital signs reviewed and stable Respiratory status: spontaneous breathing Cardiovascular status: stable Anesthetic complications: no    Last Vitals:  Vitals:   06/12/18 0100 06/12/18 0338  BP: (!) 151/92 109/75  Pulse: (!) 104 79  Resp: 15 16  Temp:  37.1 C  SpO2: 95% 95%    Last Pain:  Vitals:   06/12/18 0338  TempSrc: Oral  PainSc:                  Nolon Nations

## 2018-07-06 LAB — FUNGUS CULTURE WITH STAIN

## 2018-07-06 LAB — FUNGUS CULTURE RESULT

## 2018-07-06 LAB — FUNGAL ORGANISM REFLEX

## 2018-07-08 LAB — FUNGAL ORGANISM REFLEX

## 2018-07-08 LAB — FUNGUS CULTURE WITH STAIN

## 2018-07-08 LAB — FUNGUS CULTURE RESULT

## 2018-07-15 LAB — FUNGAL ORGANISM REFLEX

## 2018-07-15 LAB — FUNGUS CULTURE RESULT

## 2018-07-15 LAB — FUNGUS CULTURE WITH STAIN

## 2018-07-17 LAB — ACID FAST CULTURE WITH REFLEXED SENSITIVITIES (MYCOBACTERIA): Acid Fast Culture: NEGATIVE

## 2018-07-20 LAB — ACID FAST CULTURE WITH REFLEXED SENSITIVITIES (MYCOBACTERIA)
Acid Fast Culture: NEGATIVE
Acid Fast Culture: NEGATIVE

## 2018-07-21 LAB — ACID FAST CULTURE WITH REFLEXED SENSITIVITIES (MYCOBACTERIA): Acid Fast Culture: NEGATIVE

## 2019-06-30 ENCOUNTER — Other Ambulatory Visit: Payer: Self-pay

## 2019-06-30 ENCOUNTER — Emergency Department (HOSPITAL_COMMUNITY): Payer: Self-pay

## 2019-06-30 ENCOUNTER — Encounter (HOSPITAL_COMMUNITY): Payer: Self-pay

## 2019-06-30 ENCOUNTER — Emergency Department (HOSPITAL_COMMUNITY)
Admission: EM | Admit: 2019-06-30 | Discharge: 2019-06-30 | Disposition: A | Discharge status: Home / Self Care | Payer: Self-pay | Attending: Emergency Medicine | Admitting: Emergency Medicine

## 2019-06-30 DIAGNOSIS — F17210 Nicotine dependence, cigarettes, uncomplicated: Secondary | ICD-10-CM | POA: Insufficient documentation

## 2019-06-30 DIAGNOSIS — Y999 Unspecified external cause status: Secondary | ICD-10-CM | POA: Insufficient documentation

## 2019-06-30 DIAGNOSIS — Y929 Unspecified place or not applicable: Secondary | ICD-10-CM | POA: Insufficient documentation

## 2019-06-30 DIAGNOSIS — Y939 Activity, unspecified: Secondary | ICD-10-CM | POA: Insufficient documentation

## 2019-06-30 DIAGNOSIS — S43004A Unspecified dislocation of right shoulder joint, initial encounter: Secondary | ICD-10-CM

## 2019-06-30 DIAGNOSIS — S43014A Anterior dislocation of right humerus, initial encounter: Secondary | ICD-10-CM | POA: Insufficient documentation

## 2019-06-30 DIAGNOSIS — W1781XA Fall down embankment (hill), initial encounter: Secondary | ICD-10-CM | POA: Insufficient documentation

## 2019-06-30 MED ORDER — PROPOFOL 10 MG/ML IV BOLUS
INTRAVENOUS | Status: AC
  Filled 2019-06-30: qty 20

## 2019-06-30 MED ORDER — PROPOFOL 10 MG/ML IV BOLUS
0.5000 mg/kg | Freq: Once | INTRAVENOUS | Status: DC
  Filled 2019-06-30: qty 20

## 2019-06-30 MED ORDER — LIDOCAINE HCL (PF) 1 % IJ SOLN
30.0000 mL | Freq: Once | INTRAMUSCULAR | Status: DC
  Filled 2019-06-30: qty 30

## 2019-06-30 MED ORDER — PROPOFOL 10 MG/ML IV BOLUS
INTRAVENOUS | Status: AC | PRN
  Administered 2019-06-30: 40 mg via INTRAVENOUS
  Administered 2019-06-30 (×2): 30 mg via INTRAVENOUS
  Administered 2019-06-30: 20 mg via INTRAVENOUS
  Administered 2019-06-30: 35 mg via INTRAVENOUS
  Administered 2019-06-30: 20 mg via INTRAVENOUS
  Administered 2019-06-30: 70 mg via INTRAVENOUS
  Administered 2019-06-30: 25 mg via INTRAVENOUS

## 2019-06-30 NOTE — ED Triage Notes (Signed)
Pt was walking down a hill to get gasoline when he slipped on the mud, went to catch himself and fell on his arm, dislocated shoulder. Had to climb over fence to get gasoline after dislocating shoulder. 9/10 pain in R shoulder, obviously dislocated

## 2019-06-30 NOTE — ED Notes (Signed)
Paper consent signed and placed in medical records

## 2019-06-30 NOTE — ED Notes (Signed)
Patient verbalizes understanding of discharge instructions. Opportunity for questioning and answers were provided. Armband removed by staff, pt discharged from ED ambulatory with ride home with friend

## 2019-06-30 NOTE — Progress Notes (Signed)
Orthopedic Tech Progress Note Patient Details:  Todd Hooper 1965/06/18 [id number]  Ortho Devices Type of Ortho Device: Shoulder immobilizer Ortho Device/Splint Location: RUE Ortho Device/Splint Interventions: Ordered, Application, Adjustment   Post Interventions Patient Tolerated: Well Instructions Provided: Care of device, Adjustment of device   [name] 06/30/2019, 8:38 PM

## 2019-06-30 NOTE — Discharge Instructions (Signed)
Wear the shoulder immobilizer for the next 1 to 2 weeks.  After a few days you can start ranging her shoulder again.  Use Tylenol or ibuprofen as needed for discomfort.

## 2019-06-30 NOTE — ED Provider Notes (Signed)
[facility] EMERGENCY DEPARTMENT Provider Note   CSN: [id number] Arrival date & time: 06/30/19  1759     History Chief Complaint  Patient presents with  . Shoulder Pain    Rykar Lebleu is a 54 y.o. male.  The history is provided by the patient.  Shoulder Pain Location:  Shoulder Shoulder location:  R shoulder Injury: yes   Time since incident:  45 minutes Mechanism of injury: fall   Fall:    Fall occurred: Was walking down a hill to get gasoline when he slipped in the mud and caught himself with his right arm causing his shoulder to dislocate.  Since that time he has had 9 out of 10 pain in the right shoulder with inability to move.   Impact surface:  Dirt   Point of impact:  Hands Pain details:    Quality:  Sharp, shooting and throbbing   Radiates to:  R shoulder   Severity:  Severe   Onset quality:  Sudden   Timing:  Constant   Progression:  Unchanged Handedness:  Right-handed Dislocation: yes   Prior injury to area:  Yes Relieved by:  Nothing Worsened by:  Movement Associated symptoms: decreased range of motion   Associated symptoms: no back pain, no muscle weakness and no numbness        History reviewed. No pertinent past medical history.  Patient Active Problem List   Diagnosis Date Noted  . Empyema of left pleural space (HCC) 06/04/2018  . PNA (pneumonia)/Lt Sided Efusion/Empyema 06/03/2018  . Polysubstance abuse -- IVDU (Cocaine) 06/03/2018  . Tobacco abuse 06/03/2018    Past Surgical History:  Procedure Laterality Date  . BACK SURGERY    . HERNIA REPAIR    . TEE WITHOUT CARDIOVERSION N/A 06/11/2018   Procedure: TRANSESOPHAGEAL ECHOCARDIOGRAM (TEE);  Surgeon: Elease Hashimoto Deloris Ping, MD;  Location: [facility] ENDOSCOPY;  Service: Cardiovascular;  Laterality: N/A;  . VIDEO ASSISTED THORACOSCOPY (VATS)/DECORTICATION Left 06/04/2018   Procedure: Left Video Assisted Thoracoscopy (Vats)/Decortication and Drainage of Empyema;  Surgeon: Delight Ovens,  MD;  Location: [facility] OR;  Service: Thoracic;  Laterality: Left;  Marland Kitchen VIDEO BRONCHOSCOPY N/A 06/04/2018   Procedure: VIDEO BRONCHOSCOPY;  Surgeon: Delight Ovens, MD;  Location: [facility] OR;  Service: Thoracic;  Laterality: N/A;  . WOUND EXPLORATION Left 06/04/2018   Procedure: Packing of Left Upper Arm Wound;  Surgeon: Delight Ovens, MD;  Location: [facility] OR;  Service: Thoracic;  Laterality: Left;       History reviewed. No pertinent family history.  Social History   Tobacco Use  . Smoking status: Current Every Day Smoker    Types: Cigarettes  . Smokeless tobacco: Never Used  Substance Use Topics  . Alcohol use: Yes    Comment: Occasionally   . Drug use: Not Currently    Types: Cocaine, Marijuana    Home Medications Prior to Admission medications   Medication Sig Start Date End Date Taking? Authorizing Provider  ibuprofen (ADVIL,MOTRIN) 200 MG tablet Take 800 mg by mouth as needed for moderate pain.    [provider]    Allergies    Patient has no known allergies.  Review of Systems   Review of Systems  Musculoskeletal: Negative for back pain.  All other systems reviewed and are negative.   Physical Exam Updated Vital Signs BP (!) 158/99   Pulse 93   Ht 5\' 10"  (1.778 m)   Wt 72.6 kg   SpO2 99%   BMI 22.96 kg/m  Physical Exam Vitals and nursing note reviewed.  Constitutional:      General: He is not in acute distress.    Appearance: He is well-developed.     Comments: Patient appears uncomfortable and in pain  HENT:     Head: Normocephalic and atraumatic.  Eyes:     Conjunctiva/sclera: Conjunctivae normal.     Pupils: Pupils are equal, round, and reactive to light.  Cardiovascular:     Rate and Rhythm: Normal rate and regular rhythm.     Heart sounds: No murmur.  Pulmonary:     Effort: Pulmonary effort is normal. No respiratory distress.     Breath sounds: Normal breath sounds. No wheezing or rales.  Abdominal:     General: There is no distension.       Palpations: Abdomen is soft.     Tenderness: There is no abdominal tenderness. There is no guarding or rebound.  Musculoskeletal:        General: Tenderness and deformity present.     Right shoulder: Deformity, tenderness and bony tenderness present. Decreased range of motion.     Cervical back: Normal range of motion and neck supple.     Comments: 2+ radial pulse present in the right hand with normal hand movements  Skin:    General: Skin is warm and dry.     Findings: No erythema or rash.  Neurological:     Mental Status: He is alert and oriented to person, place, and time.  Psychiatric:        Mood and Affect: Mood normal.        Behavior: Behavior normal.        Thought Content: Thought content normal.     ED Results / Procedures / Treatments   Labs (all labs ordered are listed, but only abnormal results are displayed) Labs Reviewed - No data to display  EKG None  Radiology DG Shoulder Right Portable  Result Date: 06/30/2019 CLINICAL DATA:  Shoulder dislocation, postreduction. EXAM: PORTABLE RIGHT SHOULDER COMPARISON:  Pre reduction radiographs earlier this day. FINDINGS: Previous anterior shoulder dislocation has been reduced. Alignment is currently normal. There is a Hill-Sachs impaction injury to the lateral humeral head. Mild acromioclavicular degenerative change. IMPRESSION: Reduction of anterior shoulder dislocation. Hill-Sachs impaction injury to the lateral humeral head. Electronically Signed   By: Keith Rake M.D.   On: 06/30/2019 20:08   DG Shoulder Right Portable  Result Date: 06/30/2019 CLINICAL DATA:  Pain EXAM: PORTABLE RIGHT SHOULDER COMPARISON:  None. FINDINGS: There is an anterior inferior glenohumeral dislocation. No definite acute displaced fracture. The osseous mineralization is within normal limits. IMPRESSION: Anterior inferior glenohumeral dislocation. Electronically Signed   By: Constance Holster M.D.   On: 06/30/2019 18:50     Procedures .Sedation  Date/Time: 06/30/2019 8:04 PM Performed by: Blanchie Dessert, MD Authorized by: Blanchie Dessert, MD   Consent:    Consent obtained:  Verbal   Consent given by:  Patient   Risks discussed:  Allergic reaction, dysrhythmia, inadequate sedation, nausea, prolonged hypoxia resulting in organ damage, prolonged sedation necessitating reversal, respiratory compromise necessitating ventilatory assistance and intubation and vomiting   Alternatives discussed:  Analgesia without sedation, anxiolysis and regional anesthesia Universal protocol:    Procedure explained and questions answered to patient or proxy's satisfaction: yes     Relevant documents present and verified: yes     Test results available and properly labeled: yes     Imaging studies available: yes     Required blood products,  implants, devices, and special equipment available: yes     Site/side marked: yes     Immediately prior to procedure a time out was called: yes     Patient identity confirmation method:  Verbally with patient Indications:    Procedure performed:  Dislocation reduction   Procedure necessitating sedation performed by:  Physician performing sedation Pre-sedation assessment:    Time since last food or drink:  2 hours ago   ASA classification: class 1 - normal, healthy patient     Neck mobility: normal     Mouth opening:  3 or more finger widths   Thyromental distance:  4 finger widths   Mallampati score:  I - soft palate, uvula, fauces, pillars visible   Pre-sedation assessments completed and reviewed: airway patency, cardiovascular function, hydration status, mental status, nausea/vomiting, pain level, respiratory function and temperature     Pre-sedation assessment completed:  06/30/2019 7:08 PM Immediate pre-procedure details:    Reassessment: Patient reassessed immediately prior to procedure     Reviewed: vital signs, relevant labs/tests and NPO status     Verified: bag valve  mask available, emergency equipment available, intubation equipment available, IV patency confirmed, oxygen available and suction available   Procedure details (see MAR for exact dosages):    Preoxygenation:  Nasal cannula   Sedation:  Propofol   Intended level of sedation: deep   Intra-procedure monitoring:  Blood pressure monitoring, cardiac monitor, continuous pulse oximetry, frequent LOC assessments, frequent vital sign checks and continuous capnometry   Intra-procedure events: none     Intra-procedure management:  Supplemental oxygen   Total Provider sedation time (minutes):  25 Post-procedure details:    Post-sedation assessment completed:  06/30/2019 8:09 PM   Attendance: Constant attendance by certified staff until patient recovered     Recovery: Patient returned to pre-procedure baseline     Post-sedation assessments completed and reviewed: airway patency, cardiovascular function, hydration status, mental status, nausea/vomiting, pain level, respiratory function and temperature     Patient is stable for discharge or admission: yes     Patient tolerance:  Tolerated well, no immediate complications  Reduction of dislocation  Date/Time: 06/30/2019 8:11 PM Performed by: Gwyneth Sprout, MD Authorized by: Gwyneth Sprout, MD  Consent: Verbal consent obtained. Written consent obtained. Risks and benefits: risks, benefits and alternatives were discussed Consent given by: patient Patient understanding: patient states understanding of the procedure being performed Patient consent: the patient's understanding of the procedure matches consent given Procedure consent: procedure consent matches procedure scheduled Relevant documents: relevant documents present and verified Imaging studies: imaging studies available Patient identity confirmed: verbally with patient Time out: Immediately prior to procedure a "time out" was called to verify the correct patient, procedure, equipment, support  staff and site/side marked as required. Local anesthesia used: yes Anesthesia: local infiltration  Anesthesia: Local anesthesia used: yes Local Anesthetic: lidocaine 1% without epinephrine Anesthetic total: 10 mL  Sedation: Patient sedated: yes Sedation type: moderate (conscious) sedation Sedatives: propofol Sedation start date/time: 06/30/2019 7:13 PM Sedation end date/time: 06/30/2019 7:45 PM Vitals: Vital signs were monitored during sedation.  Patient tolerance: patient tolerated the procedure well with no immediate complications Comments: Initial reduction attempt failed and he was still dislocated.  Second sedation with more shoulder abduction and external rotation with large pop in anatomy returning to normal visual appearance.  Pain has improved.    (including critical care time)  Medications Ordered in ED Medications  lidocaine (PF) (XYLOCAINE) 1 % injection 30 mL (has no administration in  time range)  propofol (DIPRIVAN) 10 mg/mL bolus/IV push 36.3 mg (has no administration in time range)    ED Course  I have reviewed the triage vital signs and the nursing notes.  Pertinent labs & imaging results that were available during my care of the patient were reviewed by me and considered in my medical decision making (see chart for details).    MDM Rules/Calculators/A&P                      Patient is a 54 year old male with a prior history of substance abuse and recurrent shoulder dislocation who is presenting today after a fall and dislocating his right shoulder.  He reports the last time his shoulder was out was approximately 1 year ago.  In the past he has not tolerated awake reduction but has required sedation.  Patient is neurovascularly intact at this time but is having a lot of pain.  Shoulder injected with lidocaine with only minimal help.  Will use propofol for sedation which patient has consented is okay.  Plain film shows anterior dislocated shoulder.  8:30  PM Repeat imaging shows reduction of anterior shoulder dislocation with a Hill-Sachs impaction injury to the lateral humeral head.  On reevaluation sedation has worn off and patient is feeling much better.  He was placed in a shoulder immobilizer.  He was given follow-up with orthopedics.  Final Clinical Impression(s) / ED Diagnoses Final diagnoses:  Shoulder dislocation, right, initial encounter    Rx / DC Orders ED Discharge Orders    None       Gwyneth Sprout, MD 06/30/19 2030

## 2019-06-30 NOTE — Sedation Documentation (Signed)
Pt had ride walk back to room to deliver him some clothes. Pt has secure ride home

## 2020-07-01 IMAGING — CT CT HEAD W/O CM
3 series · 16 of 47 positions shown, 19 images · non-contrast
Comparison: None.

CLINICAL DATA: Fall.

EXAM:
CT HEAD WITHOUT CONTRAST
TECHNIQUE: Contiguous axial images were obtained from the base of the skull
through the vertex without intravenous contrast.

[Series 3: head 5.0 h30s · axial · 0.43mm/px · z∈[-143,+7]mm · 10 of 36 slices shown, 13 images]
[im 3/36  brain]
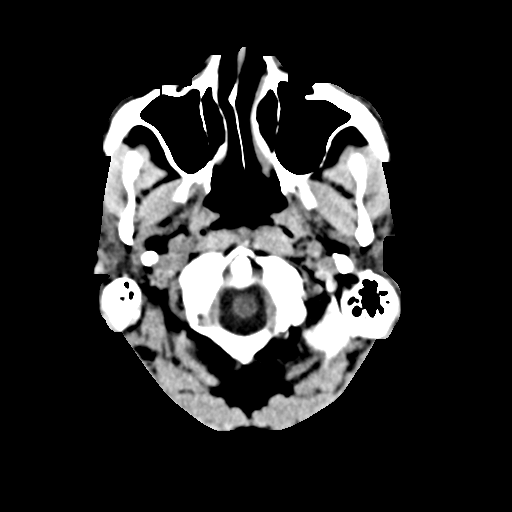
[im 3/36  bone]
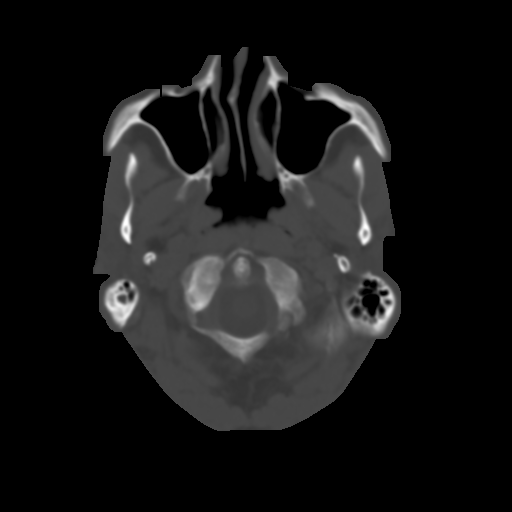
[im 7/36  brain]
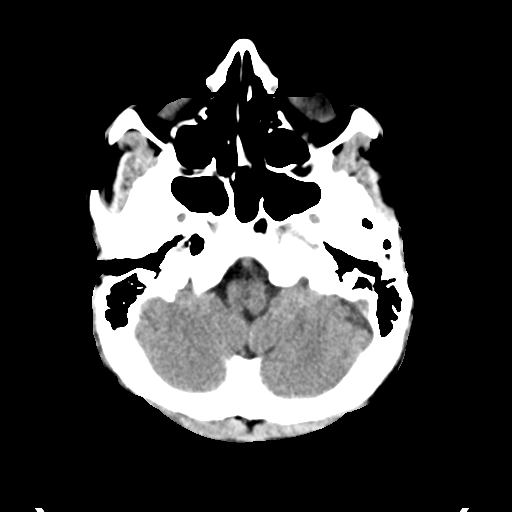
[im 10/36  brain]
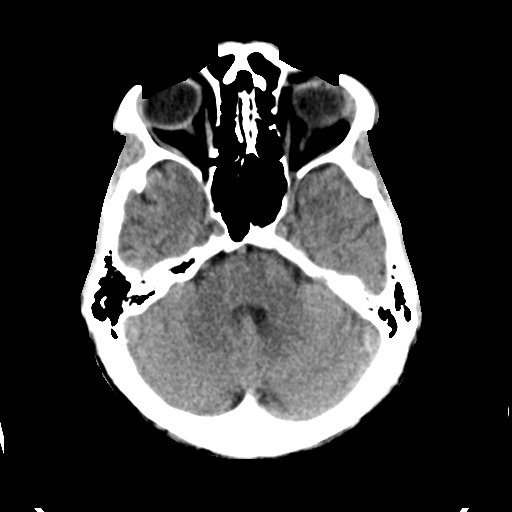
[im 13/36  brain]
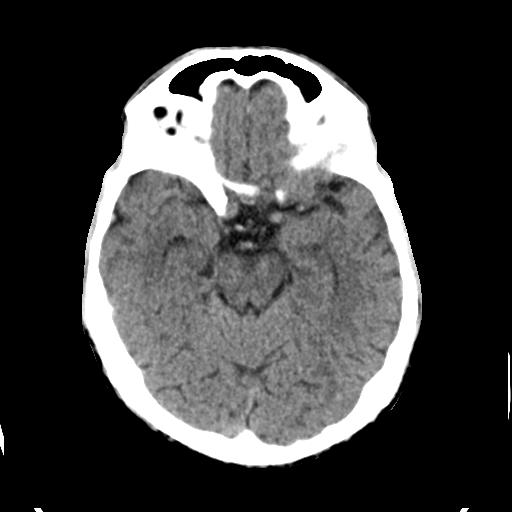
[im 16/36  brain]
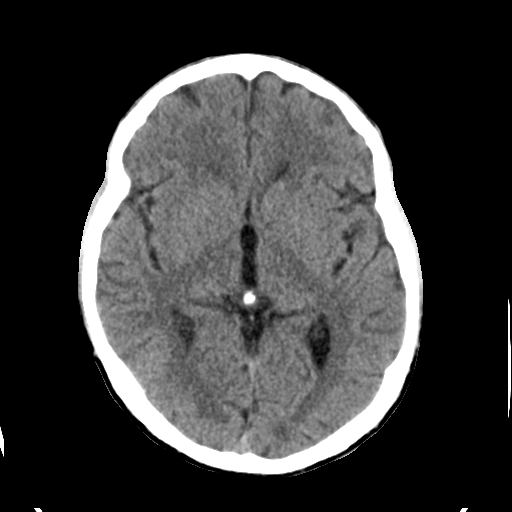
[im 16/36  bone]
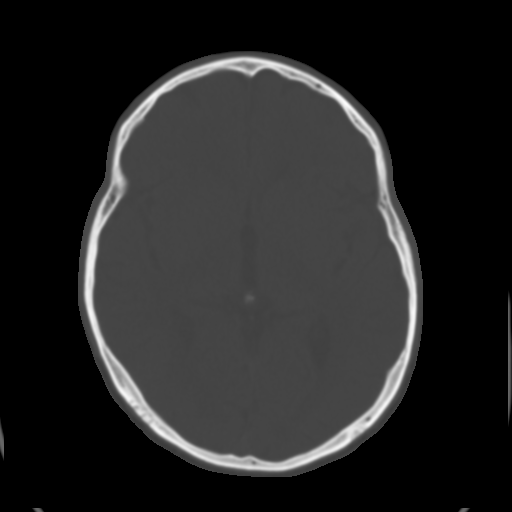
[im 20/36  brain]
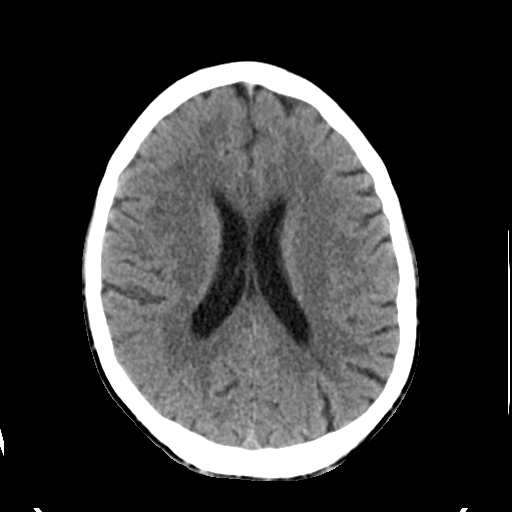
[im 23/36  brain]
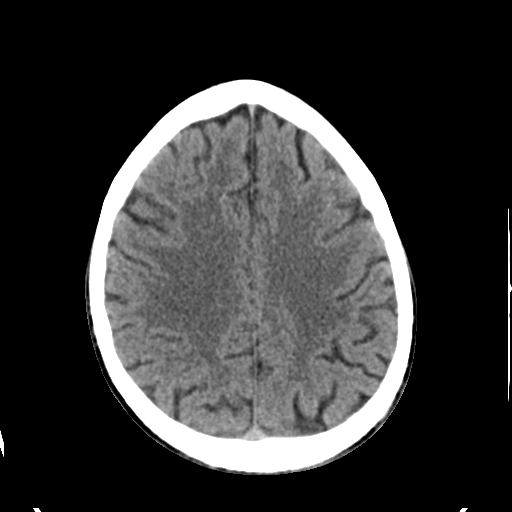
[im 27/36  brain]
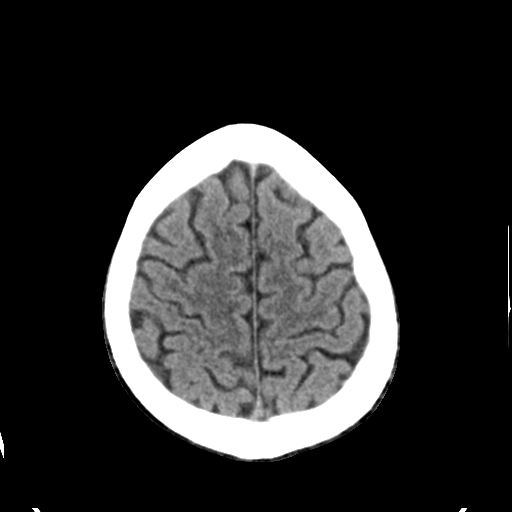
[im 29/36  brain]
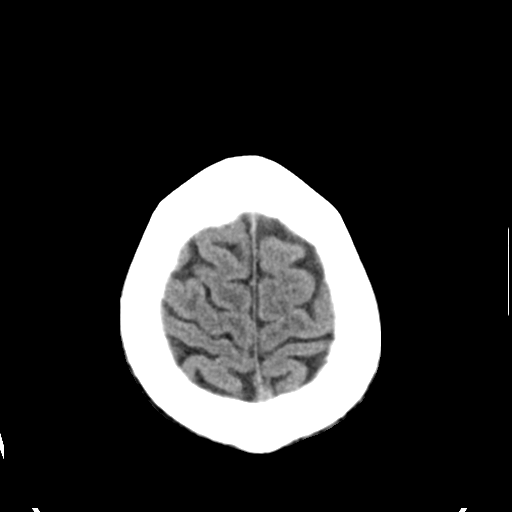
[im 29/36  bone]
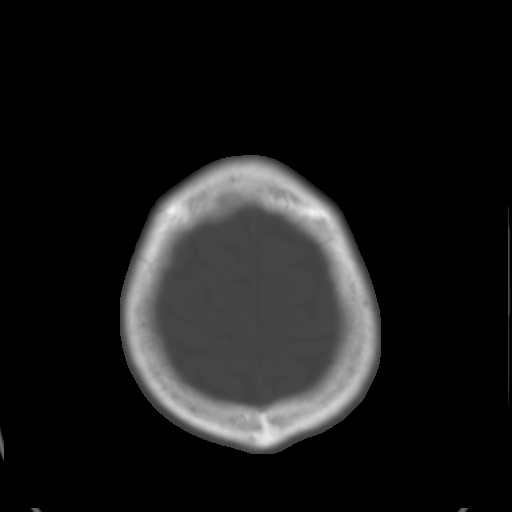
[im 33/36  brain]
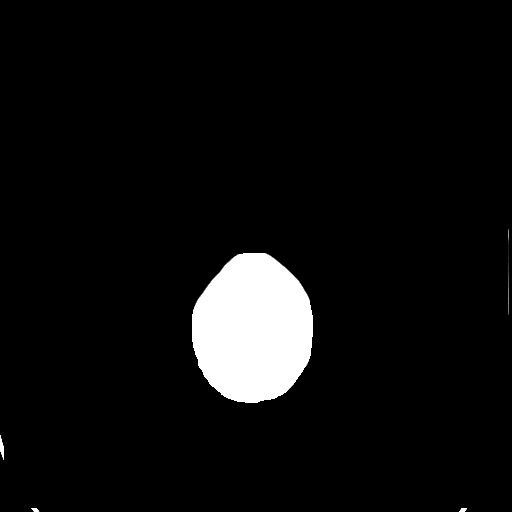

[Series 5: head 3.0 mpr cor · coronal · 0.35mm/px · 3 of 67 slices shown]
[im 23/67  brain]
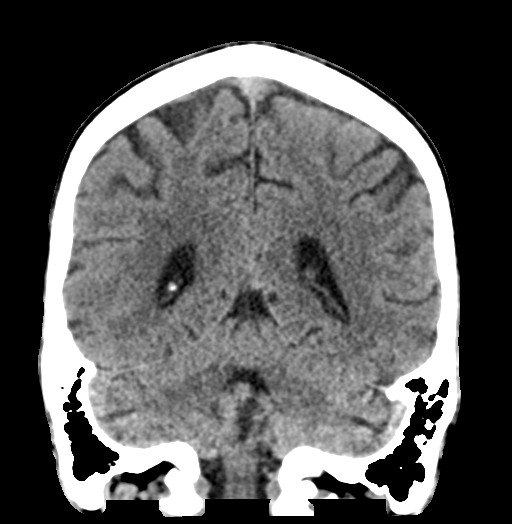
[im 30/67  brain]
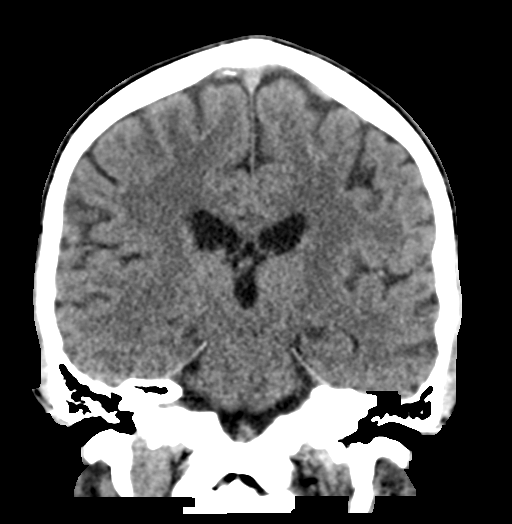
[im 37/67  brain]
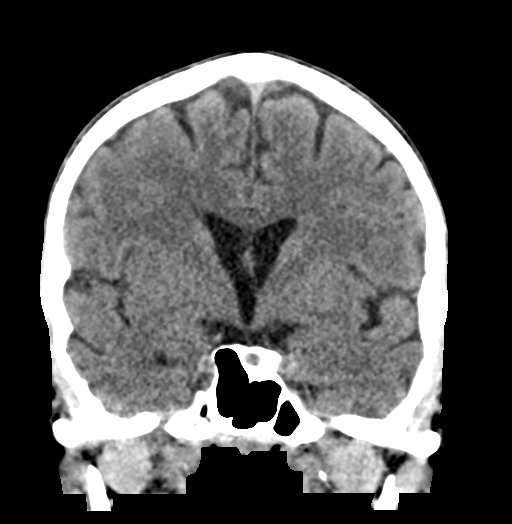

[Series 6: head 3.0 mpr sag · sagittal · 0.35mm/px · 3 of 59 slices shown]
[im 20/59  brain]
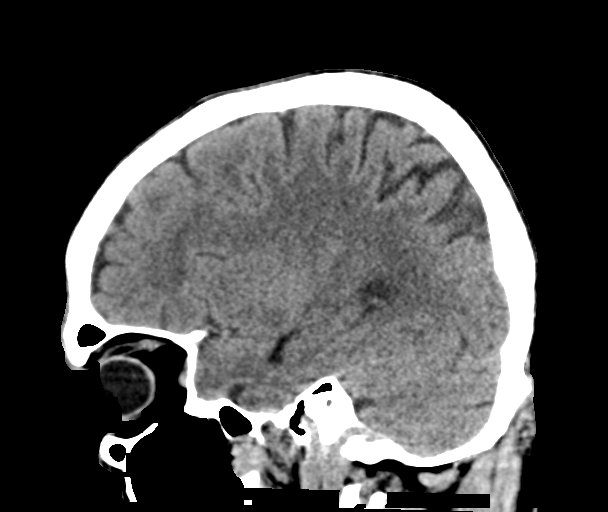
[im 30/59  brain]
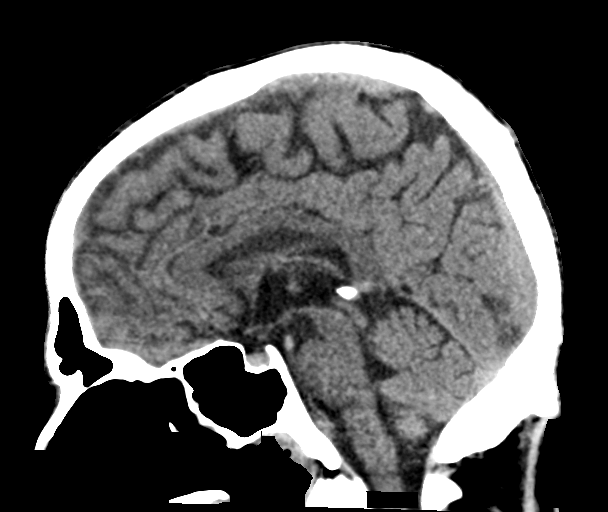
[im 39/59  brain]
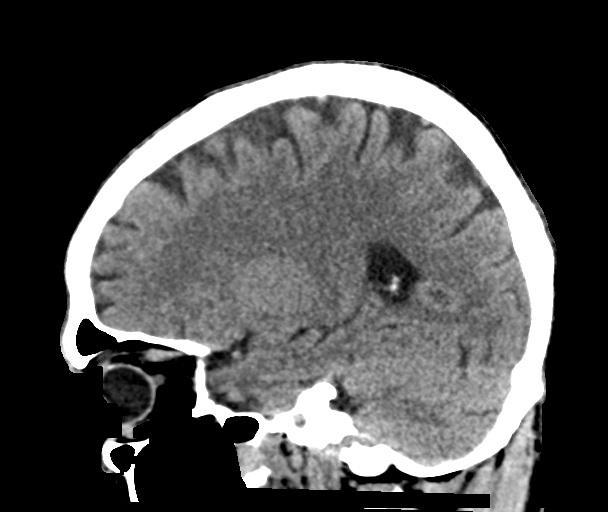

[16 of 47 positions shown; findings below may reference images not displayed]

FINDINGS: Brain: No evidence of acute infarction, hemorrhage, hydrocephalus,
extra-axial collection or mass lesion/mass effect.

Vascular: No hyperdense vessel or unexpected calcification.

Skull: Calvarium appears intact. No acute depressed skull fractures.

Sinuses/Orbits: Paranasal sinuses and mastoid air cells are clear.

Other: None.
IMPRESSION: No acute intracranial abnormalities.

## 2021-07-19 IMAGING — DX DG SHOULDER 2+V PORT*R*
1 series · 1 of 1 positions shown · non-contrast
Comparison: Pre reduction radiographs earlier this day.

CLINICAL DATA: Shoulder dislocation, postreduction.

EXAM:
PORTABLE RIGHT SHOULDER

[shoulder]
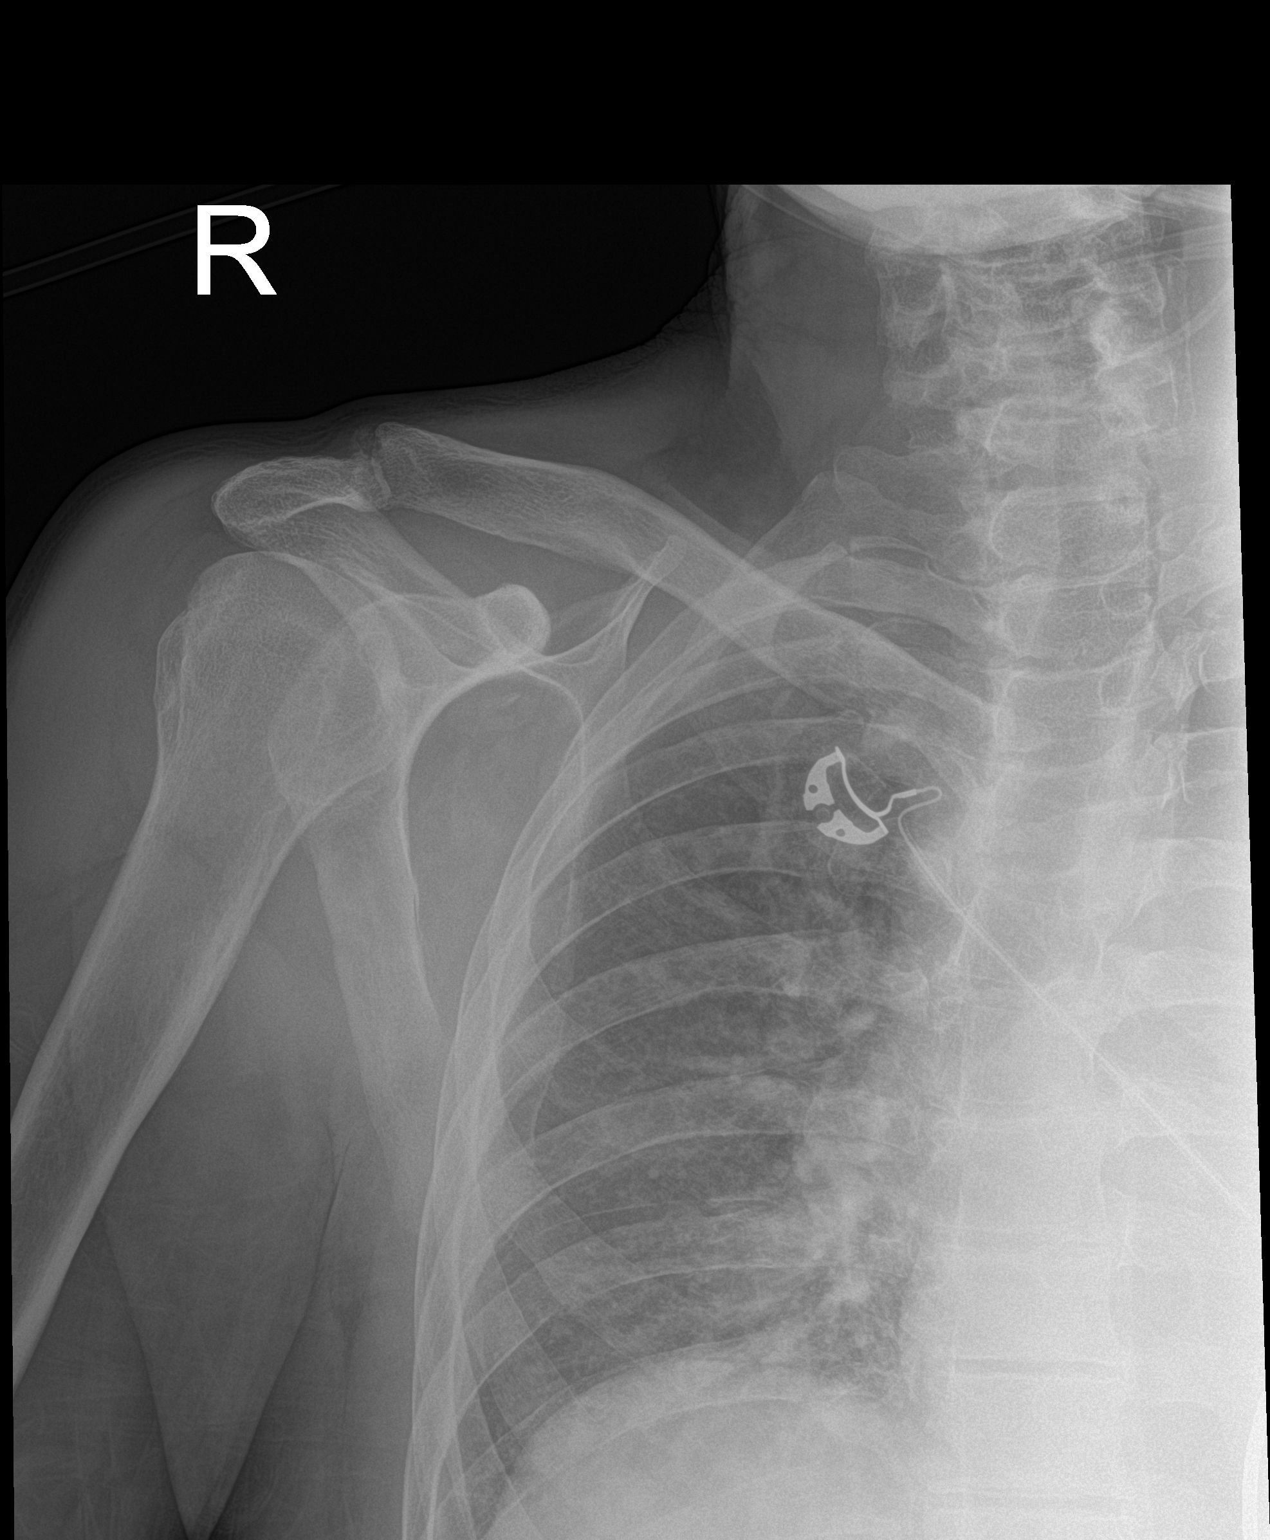

[1 of 1 positions shown; findings below may reference images not displayed]

FINDINGS: Previous anterior shoulder dislocation has been reduced. Alignment
is currently normal. There is a Hill-Sachs impaction injury to the
lateral humeral head. Mild acromioclavicular degenerative change.
IMPRESSION: Reduction of anterior shoulder dislocation. Hill-Sachs impaction
injury to the lateral humeral head.
